# Patient Record
Sex: Female | Born: 1950 | Hispanic: Yes | Marital: Single | Smoking: Never smoker
Health system: Southern US, Community
[De-identification: ages and names within clinical notes are randomized; demographics above are authoritative.]

## PROBLEM LIST (undated history)

## (undated) DIAGNOSIS — Z973 Presence of spectacles and contact lenses: Secondary | ICD-10-CM

## (undated) DIAGNOSIS — T8859XA Other complications of anesthesia, initial encounter: Secondary | ICD-10-CM

## (undated) DIAGNOSIS — T4145XA Adverse effect of unspecified anesthetic, initial encounter: Secondary | ICD-10-CM

## (undated) DIAGNOSIS — S5290XA Unspecified fracture of unspecified forearm, initial encounter for closed fracture: Secondary | ICD-10-CM

## (undated) HISTORY — PX: CARPAL TUNNEL RELEASE: SHX101

---
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
## 2019-09-16 ENCOUNTER — Emergency Department (HOSPITAL_COMMUNITY): Payer: Self-pay

## 2019-09-16 ENCOUNTER — Other Ambulatory Visit: Payer: Self-pay

## 2019-09-16 ENCOUNTER — Emergency Department (HOSPITAL_COMMUNITY)
Admission: EM | Admit: 2019-09-16 | Discharge: 2019-09-16 | Disposition: A | Discharge status: Home / Self Care | Payer: Self-pay | Attending: Emergency Medicine | Admitting: Emergency Medicine

## 2019-09-16 DIAGNOSIS — S52611A Displaced fracture of right ulna styloid process, initial encounter for closed fracture: Secondary | ICD-10-CM | POA: Insufficient documentation

## 2019-09-16 DIAGNOSIS — W19XXXA Unspecified fall, initial encounter: Secondary | ICD-10-CM

## 2019-09-16 DIAGNOSIS — W0110XA Fall on same level from slipping, tripping and stumbling with subsequent striking against unspecified object, initial encounter: Secondary | ICD-10-CM | POA: Insufficient documentation

## 2019-09-16 DIAGNOSIS — Y999 Unspecified external cause status: Secondary | ICD-10-CM | POA: Insufficient documentation

## 2019-09-16 DIAGNOSIS — S52501A Unspecified fracture of the lower end of right radius, initial encounter for closed fracture: Secondary | ICD-10-CM | POA: Insufficient documentation

## 2019-09-16 DIAGNOSIS — Y9289 Other specified places as the place of occurrence of the external cause: Secondary | ICD-10-CM | POA: Insufficient documentation

## 2019-09-16 DIAGNOSIS — Y939 Activity, unspecified: Secondary | ICD-10-CM | POA: Insufficient documentation

## 2019-09-16 MED ORDER — FENTANYL CITRATE (PF) 100 MCG/2ML IJ SOLN
50.0000 ug | Freq: Once | INTRAMUSCULAR | Status: AC
  Administered 2019-09-16: 50 ug via INTRAVENOUS
  Filled 2019-09-16: qty 2

## 2019-09-16 MED ORDER — MORPHINE SULFATE (PF) 2 MG/ML IV SOLN
2.0000 mg | Freq: Once | INTRAVENOUS | Status: AC
  Administered 2019-09-16: 2 mg via INTRAVENOUS
  Filled 2019-09-16: qty 1

## 2019-09-16 MED ORDER — CEPHALEXIN 500 MG PO CAPS: 500.0000 mg | ORAL_CAPSULE | Freq: Four times a day (QID) | ORAL | 0 refills | Status: DC

## 2019-09-16 MED ORDER — CEPHALEXIN 500 MG PO CAPS
500.0000 mg | ORAL_CAPSULE | Freq: Once | ORAL | Status: AC
  Administered 2019-09-16: 500 mg via ORAL
  Filled 2019-09-16: qty 1

## 2019-09-16 MED ORDER — HYDROCODONE-ACETAMINOPHEN 5-325 MG PO TABS
1.0000 | ORAL_TABLET | Freq: Once | ORAL | Status: AC
  Administered 2019-09-16: 1 via ORAL
  Filled 2019-09-16: qty 1

## 2019-09-16 MED ORDER — LIDOCAINE HCL (PF) 1 % IJ SOLN
10.0000 mL | Freq: Once | INTRAMUSCULAR | Status: AC
  Administered 2019-09-16: 10 mL
  Filled 2019-09-16: qty 30

## 2019-09-16 MED ORDER — HYDROCODONE-ACETAMINOPHEN 5-325 MG PO TABS: 1.0000 | ORAL_TABLET | Freq: Four times a day (QID) | ORAL | 0 refills | Status: DC | PRN

## 2019-09-16 NOTE — ED Triage Notes (Addendum)
Pt was standing on curb to take picture of car at car lot when she stepped back and fell on right wrist. Pt denies LOC and hitting head. Pt presents in [place] d/t language barrier. Pt was hypotensive after fall (118/69). Pt was given of fentanyl via IV on scene.

## 2019-09-16 NOTE — ED Provider Notes (Signed)
.  Ortho Injury Treatment  Date/Time: 09/16/2019 6:40 PM Performed by: Wynetta Fines, MD Authorized by: Wynetta Fines, MD   Consent:    Consent obtained:  Verbal   Consent given by:  Patient   Risks discussed:  Fracture, irreducible dislocation, nerve damage, recurrent dislocation, restricted joint movement, stiffness and vascular damage   Alternatives discussed:  No treatmentInjury location: wrist Location details: right wrist Injury type: fracture Fracture type: distal radius Pre-procedure neurovascular assessment: neurovascularly intact Anesthesia: local infiltration  Anesthesia: Local anesthesia used: yes Local Anesthetic: lidocaine 1% without epinephrine Anesthetic total: 3 mL Manipulation performed: yes Skin traction used: no Skeletal traction used: yes Reduction successful: yes X-ray confirmed reduction: yes Immobilization: splint and sling Splint type: sugar tong Post-procedure neurovascular assessment: post-procedure neurovascularly intact Patient tolerance: patient tolerated the procedure well with no immediate complications      Wynetta Fines, MD 09/16/19 1842

## 2019-09-16 NOTE — ED Provider Notes (Signed)
[facility]-EMERGENCY DEPT Provider Note   CSN: [id number] Arrival date & time: 09/16/19  1526     History Chief Complaint  Patient presents with  . Fall  . Wrist Injury    Bianca Johnson is a 69 y.o. female.  Bianca Johnson is a 69 y.o. female who is otherwise healthy, presents to the ED for evaluation of right wrist injury.  Patient is Spanish-speaking, her niece is at the bedside and acts as a Nurse, learning disability, offered to get Radiation protection practitioner, but patient declined.  Patient states that she was at a car lot trying to take a picture, when she stepped back she did not realize there was a car behind her and fell, but caught herself on her right hand.  She did not fall completely to the ground, did not hit her head, denies any neck or back pain.  No pain in the chest or abdomen.  No pain in the lower extremities.  She restates that she put all her weight on her right hand and has significant pain and deformity to her right wrist.  Sam splint placed by EMS.  Denies numbness tingling or weakness.  When the wrist is held still pain is mild but with any movement of the hand she has severe pain.  Did not get much relief from initial dose of fentanyl given with EMS.        No past medical history on file.  There are no problems to display for this patient.    OB History   No obstetric history on file.     No family history on file.  Social History   Tobacco Use  . Smoking status: Not on file  Substance Use Topics  . Alcohol use: Not on file  . Drug use: Not on file    Home Medications Prior to Admission medications   Medication Sig Start Date End Date Taking? Authorizing Provider  cephALEXin (KEFLEX) 500 MG capsule Take 1 capsule (500 mg total) by mouth 4 (four) times daily. 09/16/19   Dartha Lodge, PA-C  HYDROcodone-acetaminophen (NORCO) 5-325 MG tablet Take 1 tablet by mouth every 6 (six) hours as needed. 09/16/19   Dartha Lodge, PA-C    Allergies    Patient  has no allergy information on record.  Review of Systems   Review of Systems  Constitutional: Negative for chills and fever.  HENT: Negative.   Eyes: Negative for visual disturbance.  Respiratory: Negative for cough and shortness of breath.   Cardiovascular: Negative for chest pain.  Gastrointestinal: Negative for abdominal pain.  Musculoskeletal: Positive for arthralgias and joint swelling. Negative for back pain and neck pain.  Skin: Negative for color change and wound.  Neurological: Negative for dizziness, syncope and light-headedness.  All other systems reviewed and are negative.   Physical Exam Updated Vital Signs BP 114/76 (BP Location: Right Arm)   Pulse 68   Temp 97.8 F (36.6 C) (Oral)   Resp 20   Ht 5' 1.42" (1.56 m)   Wt 61 kg   SpO2 100%   BMI 25.07 kg/m   Physical Exam Vitals and nursing note reviewed.  Constitutional:      General: She is not in acute distress.    Appearance: Normal appearance. She is well-developed. She is not diaphoretic.  HENT:     Head: Normocephalic.     Comments: No palpable hematoma, step-off or deformity, negative battle sign Eyes:     General:  Right eye: No discharge.        Left eye: No discharge.  Neck:     Comments: C-collar in place, no midline C-spine tenderness, collar removed with full range of motion without discomfort Cardiovascular:     Rate and Rhythm: Normal rate and regular rhythm.     Heart sounds: Normal heart sounds.  Pulmonary:     Effort: Pulmonary effort is normal. No respiratory distress.     Breath sounds: Normal breath sounds. No wheezing or rales.     Comments: No chest wall tenderness Chest:     Chest wall: No tenderness.  Abdominal:     General: Bowel sounds are normal. There is no distension.     Palpations: Abdomen is soft. There is no mass.     Tenderness: There is no abdominal tenderness. There is no guarding.     Comments: Abdomen nontender to palpation  Musculoskeletal:         General: Deformity present.     Cervical back: Neck supple.     Comments: Obvious deformity to the right wrist with swelling, tenderness to palpation, there is a very small abrasion over the ulnar aspect of the wrist.  2+ radial pulse and good cap refill, patient is able to move fingers although it is painful, sensation intact throughout.  Skin:    General: Skin is warm and dry.     Capillary Refill: Capillary refill takes less than 2 seconds.  Neurological:     Mental Status: She is alert.     Coordination: Coordination normal.     Comments: Speech is clear, able to follow commands Moves extremities without ataxia, coordination intact  Psychiatric:        Mood and Affect: Mood normal.        Behavior: Behavior normal.     ED Results / Procedures / Treatments   Labs (all labs ordered are listed, but only abnormal results are displayed) Labs Reviewed - No data to display  EKG None  Radiology DG Forearm Right  Result Date: 09/16/2019 CLINICAL DATA:  Trip and fall, pain EXAM: RIGHT WRIST - COMPLETE 3+ VIEW; RIGHT FOREARM - 2 VIEW COMPARISON:  None. FINDINGS: Comminuted, impacted and angulated fractures of the distal right radial metadiaphysis, not clearly intra-articular. Additional mildly displaced fracture of the right ulnar styloid. The carpus proper is normally aligned. No fracture or dislocation of the proximal radius or ulna. Soft tissue edema about the wrist. IMPRESSION: Comminuted, impacted and angulated fractures of the distal right radial metadiaphysis, not clearly intra-articular. Additional mildly displaced fracture of the right ulnar styloid. Electronically Signed   By: Lauralyn Primes M.D.   On: 09/16/2019 17:23   DG Wrist Complete Right  Result Date: 09/16/2019 CLINICAL DATA:  Status post reduction EXAM: RIGHT WRIST - COMPLETE 3+ VIEW COMPARISON:  Films from earlier in the same day. FINDINGS: Distal radial and ulnar fractures are again identified. The degree of impaction has  been reduced somewhat. The posteriorly displaced distal radial fracture fragments have been reduced somewhat although still are proximally 1/3 bone width displaced posteriorly. No other fractures are seen. IMPRESSION: Status post reduction and casting. There remains some posterior displacement of the distal radial fracture fragments as described. Electronically Signed   By: Alcide Clever M.D.   On: 09/16/2019 19:02   DG Wrist Complete Right  Result Date: 09/16/2019 CLINICAL DATA:  Trip and fall, pain EXAM: RIGHT WRIST - COMPLETE 3+ VIEW; RIGHT FOREARM - 2 VIEW COMPARISON:  None. FINDINGS:  Comminuted, impacted and angulated fractures of the distal right radial metadiaphysis, not clearly intra-articular. Additional mildly displaced fracture of the right ulnar styloid. The carpus proper is normally aligned. No fracture or dislocation of the proximal radius or ulna. Soft tissue edema about the wrist. IMPRESSION: Comminuted, impacted and angulated fractures of the distal right radial metadiaphysis, not clearly intra-articular. Additional mildly displaced fracture of the right ulnar styloid. Electronically Signed   By: Lauralyn Primes M.D.   On: 09/16/2019 17:23    Procedures Procedures (including critical care time)  Medications Ordered in ED Medications  fentaNYL (SUBLIMAZE) injection 50 mcg (50 mcg Intravenous Given 09/16/19 1630)  morphine 2 MG/ML injection 2 mg (2 mg Intravenous Given 09/16/19 1814)  lidocaine (PF) (XYLOCAINE) 1 % injection 10 mL (10 mLs Infiltration Given by Other 09/16/19 1816)  cephALEXin (KEFLEX) capsule 500 mg (500 mg Oral Given 09/16/19 1935)  HYDROcodone-acetaminophen (NORCO/VICODIN) 5-325 MG per tablet 1 tablet (1 tablet Oral Given 09/16/19 1935)    ED Course  I have reviewed the triage vital signs and the nursing notes.  Pertinent labs & imaging results that were available during my care of the patient were reviewed by me and considered in my medical decision making (see chart for  details).    MDM Rules/Calculators/A&P                     69 year old female presents via EMS after a witnessed fall where she caught herself on the right hand and then had pain and deformity to the right wrist.  There is a small abrasion to the ulnar aspect of the wrist, but this does not appear to be an open fracture.  EMS applied c-collar due to language barrier at the scene, but after speaking with patient with translator she did not fall completely to the ground did not hit her head and has no neck pain.  I examined the patient's neck and she has no midline tenderness and full range of motion, C-spine cleared Via Nexus criteria and c-collar removed.  No other signs of injury or trauma on exam.  Will get plain films of the right wrist and right forearm.  The right upper extremity is neurovascularly intact.  Range of motion limited due to pain.  IV pain medication given.  X-rays show comminuted, impacted and angulated fractures of the right distal radial metadiaphysis, there is also mildly displaced right ulnar styloid fracture.  Will discuss with on-call hand surgeon.  Case discussed with Dr. Roney Mans with hand surgery who requests hematoma block and reduction here in the emergency department, followed by splinting, he will then see the patient for follow-up in the office, this will require surgical repair.  Hematoma block and reduction performed by Dr. Rodena Medin, please see his separate documentation.  Fingertrap and weights use.  Patient placed in sugar tong splint.  Patient reports significant improvement in pain after splinting, good sensation and no discoloration of the fingers after splint application.  Post reduction x-rays show some improvement in displacement.  Patient given dose of oral pain medicine as well as Keflex to prevent any infection given there is a small abrasion underneath the splint, this was cleaned and a dressing was applied.  Discussed appropriate follow-up, and return  precautions with the patient and her niece.  They expressed understanding and agreement with plan.  Discharged home in good condition.   Final Clinical Impression(s) / ED Diagnoses Final diagnoses:  Fall, initial encounter  Closed fracture of distal end of  right radius, unspecified fracture morphology, initial encounter  Closed displaced fracture of styloid process of right ulna, initial encounter    Rx / DC Orders ED Discharge Orders         Ordered    cephALEXin (KEFLEX) 500 MG capsule  4 times daily     09/16/19 1937    HYDROcodone-acetaminophen (NORCO) 5-325 MG tablet  Every 6 hours PRN     09/16/19 1937           Janet Berlin 09/16/19 1958    Valarie Merino, MD 09/16/19 2111

## 2019-09-16 NOTE — Discharge Instructions (Addendum)
You have a fracture to your wrist that was placed in a splint today.  You will need to remain in the splint until you see Dr. Roney Mans with orthopedics.  This will need surgery to repair.  Elevate the wrist and apply ice.  Take Tylenol 650 mg every 6 hours, in addition to this use prescribed pain medication, this medicine can cause drowsiness, do not take before driving.  Take Keflex to help prevent infection around small cut on the wrist.  If you develop significantly worsened pain numbness or tingling in your fingers, or any discoloration of the fingers unwrap the splint and come back to the emergency department  -----------------------------------------------------------------------------------------  Shelle Iron fractura en la mueca que le colocaron una frula hoy. Deber permanecer en la frula hasta que vea al Dr. Roney Mans con ortopedia. Esto requerir ciruga para repararlo.  Eleve la mueca y aplique hielo. Tome Tylenol 650 mg cada 6 horas, adems de [address] de analgsicos recetados, este medicamento puede causar somnolencia, no lo tome antes de conducir.  Tome Keflex para ayudar a prevenir infecciones alrededor de una pequea herida en la mueca.  Si desarrolla un dolor que [employer], entumecimiento u hormigueo en los dedos, o cualquier decoloracin de los dedos, desenvuelva la frula y regrese al departamento de Sports administrator.

## 2019-09-20 ENCOUNTER — Other Ambulatory Visit (HOSPITAL_COMMUNITY)
Admission: RE | Admit: 2019-09-20 | Discharge: 2019-09-20 | Disposition: A | Discharge status: Home / Self Care | Payer: HRSA Program | Source: Ambulatory Visit | Attending: Orthopaedic Surgery | Admitting: Orthopaedic Surgery

## 2019-09-20 ENCOUNTER — Other Ambulatory Visit: Payer: Self-pay

## 2019-09-20 ENCOUNTER — Encounter (HOSPITAL_COMMUNITY): Payer: Self-pay | Admitting: Orthopaedic Surgery

## 2019-09-20 DIAGNOSIS — Z20822 Contact with and (suspected) exposure to covid-19: Secondary | ICD-10-CM | POA: Diagnosis not present

## 2019-09-20 DIAGNOSIS — Z01812 Encounter for preprocedural laboratory examination: Secondary | ICD-10-CM | POA: Diagnosis present

## 2019-09-20 LAB — SARS CORONAVIRUS 2 (TAT 6-24 HRS): SARS Coronavirus 2: NEGATIVE

## 2019-09-20 NOTE — Progress Notes (Signed)
SDW-pre-op call completed by pt using Spanish [employer] # [phone]. Pt denies SOB, chest pain, and being under the care of a cardiologist. Pt denies having a PCP. Pt denies having a stress test, echo and cardiac cath. Pt denies having an EKG and chest x ray. Pt denies recent labs. Pt made aware to stop taking  Aspirin (unless otherwise advised by surgeon), vitamins, fish oil and herbal medications. Do not take any NSAIDs ie: Ibuprofen, Advil, Naproxen (Aleve), Motrin, BC and Goody Powder. Pt reminded to quarantine. Pt verbalized understanding of all pre-op instructions.

## 2019-09-21 ENCOUNTER — Encounter (HOSPITAL_COMMUNITY): Payer: Self-pay | Admitting: Orthopaedic Surgery

## 2019-09-21 ENCOUNTER — Other Ambulatory Visit: Payer: Self-pay

## 2019-09-21 ENCOUNTER — Ambulatory Visit (HOSPITAL_COMMUNITY): Payer: Self-pay | Admitting: Anesthesiology

## 2019-09-21 ENCOUNTER — Encounter (HOSPITAL_COMMUNITY)
Admission: RE | Disposition: A | Discharge status: Home / Self Care | Payer: Self-pay | Source: Home / Self Care | Attending: Orthopaedic Surgery

## 2019-09-21 ENCOUNTER — Ambulatory Visit (HOSPITAL_COMMUNITY)
Admission: RE | Admit: 2019-09-21 | Discharge: 2019-09-21 | Disposition: A | Discharge status: Home / Self Care | Payer: Self-pay | Attending: Orthopaedic Surgery | Admitting: Orthopaedic Surgery

## 2019-09-21 DIAGNOSIS — W19XXXA Unspecified fall, initial encounter: Secondary | ICD-10-CM | POA: Insufficient documentation

## 2019-09-21 DIAGNOSIS — Z79899 Other long term (current) drug therapy: Secondary | ICD-10-CM | POA: Insufficient documentation

## 2019-09-21 DIAGNOSIS — S52611A Displaced fracture of right ulna styloid process, initial encounter for closed fracture: Secondary | ICD-10-CM | POA: Insufficient documentation

## 2019-09-21 DIAGNOSIS — S52571A Other intraarticular fracture of lower end of right radius, initial encounter for closed fracture: Secondary | ICD-10-CM | POA: Insufficient documentation

## 2019-09-21 HISTORY — DX: Other complications of anesthesia, initial encounter: T88.59XA

## 2019-09-21 HISTORY — DX: Unspecified fracture of unspecified forearm, initial encounter for closed fracture: S52.90XA

## 2019-09-21 HISTORY — PX: ORIF WRIST FRACTURE: SHX2133

## 2019-09-21 HISTORY — DX: Presence of spectacles and contact lenses: Z97.3

## 2019-09-21 LAB — CBC
HCT: 39.6 % (ref 36.0–46.0)
Hemoglobin: 12.8 g/dL (ref 12.0–15.0)
MCH: 28.6 pg (ref 26.0–34.0)
MCHC: 32.3 g/dL (ref 30.0–36.0)
MCV: 88.6 fL (ref 80.0–100.0)
Platelets: 257 10*3/uL (ref 150–400)
RBC: 4.47 MIL/uL (ref 3.87–5.11)
RDW: 13.3 % (ref 11.5–15.5)
WBC: 6 10*3/uL (ref 4.0–10.5)
nRBC: 0 % (ref 0.0–0.2)

## 2019-09-21 LAB — SURGICAL PCR SCREEN
MRSA, PCR: NEGATIVE
Staphylococcus aureus: NEGATIVE

## 2019-09-21 SURGERY — OPEN REDUCTION INTERNAL FIXATION (ORIF) WRIST FRACTURE
Anesthesia: Monitor Anesthesia Care | Site: Wrist | Laterality: Right

## 2019-09-21 MED ORDER — PROPOFOL 500 MG/50ML IV EMUL
INTRAVENOUS | Status: DC | PRN
  Administered 2019-09-21: 150 ug/kg/min via INTRAVENOUS

## 2019-09-21 MED ORDER — MIDAZOLAM HCL 2 MG/2ML IJ SOLN
INTRAMUSCULAR | Status: AC
  Administered 2019-09-21: 1 mg via INTRAVENOUS
  Filled 2019-09-21: qty 2

## 2019-09-21 MED ORDER — MIDAZOLAM HCL 2 MG/2ML IJ SOLN: 1.0000 mg | Freq: Once | INTRAMUSCULAR | Status: AC

## 2019-09-21 MED ORDER — OXYCODONE HCL 5 MG/5ML PO SOLN: 5.0000 mg | Freq: Once | ORAL | Status: DC | PRN

## 2019-09-21 MED ORDER — FENTANYL CITRATE (PF) 250 MCG/5ML IJ SOLN
INTRAMUSCULAR | Status: AC
  Filled 2019-09-21: qty 5

## 2019-09-21 MED ORDER — FENTANYL CITRATE (PF) 100 MCG/2ML IJ SOLN: 50.0000 ug | Freq: Once | INTRAMUSCULAR | Status: AC

## 2019-09-21 MED ORDER — LACTATED RINGERS IV SOLN: INTRAVENOUS | Status: DC

## 2019-09-21 MED ORDER — HYDROCODONE-ACETAMINOPHEN 5-325 MG PO TABS: 1.0000 | ORAL_TABLET | Freq: Four times a day (QID) | ORAL | 0 refills | Status: AC | PRN

## 2019-09-21 MED ORDER — CHLORHEXIDINE GLUCONATE 4 % EX LIQD: 60.0000 mL | Freq: Once | CUTANEOUS | Status: DC

## 2019-09-21 MED ORDER — CEFAZOLIN SODIUM-DEXTROSE 2-4 GM/100ML-% IV SOLN
INTRAVENOUS | Status: AC
  Filled 2019-09-21: qty 100

## 2019-09-21 MED ORDER — PROPOFOL 10 MG/ML IV BOLUS
INTRAVENOUS | Status: AC
  Filled 2019-09-21: qty 20

## 2019-09-21 MED ORDER — POVIDONE-IODINE 10 % EX SWAB
2.0000 "application " | Freq: Once | CUTANEOUS | Status: AC
  Administered 2019-09-21: 2 via TOPICAL

## 2019-09-21 MED ORDER — PHENYLEPHRINE HCL-NACL 10-0.9 MG/250ML-% IV SOLN
INTRAVENOUS | Status: DC | PRN
  Administered 2019-09-21: 40 ug/min via INTRAVENOUS

## 2019-09-21 MED ORDER — ONDANSETRON HCL 4 MG/2ML IJ SOLN
INTRAMUSCULAR | Status: DC | PRN
  Administered 2019-09-21: 4 mg via INTRAVENOUS

## 2019-09-21 MED ORDER — CLONIDINE HCL (ANALGESIA) 100 MCG/ML EP SOLN
EPIDURAL | Status: DC | PRN
  Administered 2019-09-21: 100 ug

## 2019-09-21 MED ORDER — OXYCODONE HCL 5 MG PO TABS: 5.0000 mg | ORAL_TABLET | Freq: Once | ORAL | Status: DC | PRN

## 2019-09-21 MED ORDER — PHENYLEPHRINE 40 MCG/ML (10ML) SYRINGE FOR IV PUSH (FOR BLOOD PRESSURE SUPPORT)
PREFILLED_SYRINGE | INTRAVENOUS | Status: DC | PRN
  Administered 2019-09-21 (×3): 120 ug via INTRAVENOUS

## 2019-09-21 MED ORDER — CEFAZOLIN SODIUM-DEXTROSE 2-4 GM/100ML-% IV SOLN
2.0000 g | INTRAVENOUS | Status: AC
  Administered 2019-09-21: 2 g via INTRAVENOUS

## 2019-09-21 MED ORDER — ACETAMINOPHEN 500 MG PO TABS: 1000.0000 mg | ORAL_TABLET | Freq: Once | ORAL | Status: AC

## 2019-09-21 MED ORDER — 0.9 % SODIUM CHLORIDE (POUR BTL) OPTIME
TOPICAL | Status: DC | PRN
  Administered 2019-09-21: 15:00:00 1000 mL

## 2019-09-21 MED ORDER — BUPIVACAINE-EPINEPHRINE (PF) 0.5% -1:200000 IJ SOLN
INTRAMUSCULAR | Status: DC | PRN
  Administered 2019-09-21: 30 mL via PERINEURAL

## 2019-09-21 MED ORDER — PROMETHAZINE HCL 25 MG/ML IJ SOLN: 6.2500 mg | INTRAMUSCULAR | Status: DC | PRN

## 2019-09-21 MED ORDER — ACETAMINOPHEN 500 MG PO TABS
ORAL_TABLET | ORAL | Status: AC
  Administered 2019-09-21: 14:00:00 1000 mg via ORAL
  Filled 2019-09-21: qty 2

## 2019-09-21 MED ORDER — FENTANYL CITRATE (PF) 100 MCG/2ML IJ SOLN
INTRAMUSCULAR | Status: AC
  Administered 2019-09-21: 50 ug via INTRAVENOUS
  Filled 2019-09-21: qty 2

## 2019-09-21 MED ORDER — FENTANYL CITRATE (PF) 100 MCG/2ML IJ SOLN: 25.0000 ug | INTRAMUSCULAR | Status: DC | PRN

## 2019-09-21 SURGICAL SUPPLY — 55 items
ALCOHOL ISOPROPYL (RUBBING) (MISCELLANEOUS) ×6 IMPLANT
BIT DRILL 2.0 LNG QUCK RELEASE (BIT) ×1 IMPLANT
BIT DRILL QC 2.8X5 (BIT) ×3 IMPLANT
BNDG ELASTIC 3X5.8 VLCR STR LF (GAUZE/BANDAGES/DRESSINGS) ×3 IMPLANT
BNDG ELASTIC 4X5.8 VLCR STR LF (GAUZE/BANDAGES/DRESSINGS) ×3 IMPLANT
BNDG ESMARK 4X9 LF (GAUZE/BANDAGES/DRESSINGS) ×3 IMPLANT
BNDG GAUZE ELAST 4 BULKY (GAUZE/BANDAGES/DRESSINGS) ×3 IMPLANT
CANISTER SUCT 3000ML PPV (MISCELLANEOUS) ×3 IMPLANT
CHLORAPREP W/TINT 26 (MISCELLANEOUS) ×3 IMPLANT
CORD BIPOLAR FORCEPS 12FT (ELECTRODE) ×3 IMPLANT
COVER SURGICAL LIGHT HANDLE (MISCELLANEOUS) ×3 IMPLANT
COVER WAND RF STERILE (DRAPES) IMPLANT
CUFF TOURN SGL QUICK 18X4 (TOURNIQUET CUFF) ×3 IMPLANT
CUFF TOURN SGL QUICK 24 (TOURNIQUET CUFF)
CUFF TRNQT CYL 24X4X16.5-23 (TOURNIQUET CUFF) IMPLANT
DRAPE OEC MINIVIEW 54X84 (DRAPES) ×3 IMPLANT
DRAPE SURG 17X23 STRL (DRAPES) ×3 IMPLANT
DRILL 2.0 LNG QUICK RELEASE (BIT) ×3
GAUZE SPONGE 4X4 12PLY STRL (GAUZE/BANDAGES/DRESSINGS) ×3 IMPLANT
GAUZE XEROFORM 1X8 LF (GAUZE/BANDAGES/DRESSINGS) ×3 IMPLANT
GLOVE INDICATOR 8.0 STRL GRN (GLOVE) ×3 IMPLANT
GLOVE SURG SYN 7.5  E (GLOVE) ×2
GLOVE SURG SYN 7.5 E (GLOVE) ×1 IMPLANT
GOWN STRL REUS W/ TWL LRG LVL3 (GOWN DISPOSABLE) ×2 IMPLANT
GOWN STRL REUS W/TWL LRG LVL3 (GOWN DISPOSABLE) ×4
GUIDEWIRE ORTHO 0.054X6 (WIRE) ×9 IMPLANT
KIT BASIN OR (CUSTOM PROCEDURE TRAY) ×3 IMPLANT
KIT TURNOVER KIT B (KITS) ×3 IMPLANT
NEEDLE 22X1 1/2 (OR ONLY) (NEEDLE) IMPLANT
NS IRRIG 1000ML POUR BTL (IV SOLUTION) ×3 IMPLANT
PACK ORTHO EXTREMITY (CUSTOM PROCEDURE TRAY) ×3 IMPLANT
PAD ARMBOARD 7.5X6 YLW CONV (MISCELLANEOUS) ×6 IMPLANT
PAD CAST 3X4 CTTN HI CHSV (CAST SUPPLIES) ×1 IMPLANT
PAD CAST 4YDX4 CTTN HI CHSV (CAST SUPPLIES) ×1 IMPLANT
PADDING CAST COTTON 3X4 STRL (CAST SUPPLIES) ×2
PADDING CAST COTTON 4X4 STRL (CAST SUPPLIES) ×2
PLATE ACULOCK 2 NARROW RT (Plate) ×3 IMPLANT
SCREW 2.3X12MM (Screw) ×3 IMPLANT
SCREW CORT FT 18X2.3XLCK HEX (Screw) ×5 IMPLANT
SCREW CORTICAL LOCKING 2.3X14M (Screw) ×3 IMPLANT
SCREW CORTICAL LOCKING 2.3X18M (Screw) ×10 IMPLANT
SCREW HEXALOBE LOCKING 3.5X14M (Screw) ×3 IMPLANT
SCREW LOCK 12X3.5X HEXALOBE (Screw) ×1 IMPLANT
SCREW LOCKING 3.5X12 (Screw) ×2 IMPLANT
SCREW NONLOCK HEX 3.5X12 (Screw) ×3 IMPLANT
SPLINT FIBERGLASS 3X12 (CAST SUPPLIES) ×3 IMPLANT
SUT PROLENE 4 0 PS 2 18 (SUTURE) ×6 IMPLANT
SUT VIC AB 3-0 PS2 18 (SUTURE) IMPLANT
SYR CONTROL 10ML LL (SYRINGE) IMPLANT
TOWEL GREEN STERILE (TOWEL DISPOSABLE) ×3 IMPLANT
TOWEL GREEN STERILE FF (TOWEL DISPOSABLE) ×3 IMPLANT
TUBE CONNECTING 12'X1/4 (SUCTIONS) ×1
TUBE CONNECTING 12X1/4 (SUCTIONS) ×2 IMPLANT
UNDERPAD 30X30 (UNDERPADS AND DIAPERS) ×3 IMPLANT
WATER STERILE IRR 1000ML POUR (IV SOLUTION) ×3 IMPLANT

## 2019-09-21 NOTE — Discharge Instructions (Signed)
Discharge Instructions ° °- Keep dressings in place. Do not remove them. °- The dressings must stay dry °- Take all medication as prescribed. Transition to over the counter pain medication as your pain improves °- Keep the hand elevated over the next 48-72 hours to help with pain and swelling °- Move all digits not restricted by the dressings regularly to prevent stiffness °- Please call to schedule a follow up appointment with Dr. Konstantinos Cordoba at [phone] for 10-14 days following surgery °- Your pain medication have been send digitally to your pharmacy ° °

## 2019-09-21 NOTE — Anesthesia Preprocedure Evaluation (Addendum)
Anesthesia Evaluation  Patient identified by MRN, date of birth, ID band Patient awake    Reviewed: Allergy & Precautions, NPO status , Patient's Chart, lab work & pertinent test results  History of Anesthesia Complications Negative for: history of anesthetic complications  Airway Mallampati: II  TM Distance: >3 FB Neck ROM: Full    Dental no notable dental hx.    Pulmonary neg pulmonary ROS,    Pulmonary exam normal        Cardiovascular negative cardio ROS Normal cardiovascular exam     Neuro/Psych negative neurological ROS  negative psych ROS   GI/Hepatic negative GI ROS, Neg liver ROS,   Endo/Other  negative endocrine ROS  Renal/GU negative Renal ROS  negative genitourinary   Musculoskeletal Right radius fracture   Abdominal   Peds  Hematology negative hematology ROS (+)   Anesthesia Other Findings Day of surgery medications reviewed with patient.  Reproductive/Obstetrics negative OB ROS                            Anesthesia Physical Anesthesia Plan  ASA: I  Anesthesia Plan: Regional and MAC   Post-op Pain Management:    Induction:   PONV Risk Score and Plan: 2 and Treatment may vary due to age or medical condition, Propofol infusion and Ondansetron  Airway Management Planned: Natural Airway and Simple Face Mask  Additional Equipment: None  Intra-op Plan:   Post-operative Plan:   Informed Consent: I have reviewed the patients History and Physical, chart, labs and discussed the procedure including the risks, benefits and alternatives for the proposed anesthesia with the patient or authorized representative who has indicated his/her understanding and acceptance.       Plan Discussed with: CRNA  Anesthesia Plan Comments:        Anesthesia Quick Evaluation

## 2019-09-21 NOTE — Op Note (Signed)
PREOPERATIVE DIAGNOSIS: Displaced, right three-part intra-articular distal radius fracture with ulnar styloid avulsion fracture  POSTOPERATIVE DIAGNOSIS: Same  ATTENDING PHYSICIAN: Maudry Mayhew. Jeannie Fend, III, MD who was present and scrubbed for the entire case   ASSISTANT SURGEON: None.   ANESTHESIA: Regional with MAC  SURGICAL PROCEDURES: Open reduction and internal fixation of right three-part intra-articular distal radius fracture  SURGICAL INDICATIONS: Patient is a 69 year old female who over the weekend had a fall onto an outstretched right arm.  She was initially seen in the ER where she was found to have a comminuted and displaced, intra-articular distal radius fracture.  She underwent a closed reduction in the ER with significant provement in her alignment of her fracture but still had persistent displacement of the articular segment, dorsal comminution and dorsal angulation of the distal radius articular surface.  I had a long discussion with her regarding both operative and nonoperative treatment measures in clinic.  I did recommend proceeding forward surgical fixation secondary to the persistent displacement and angulation of her distal radius.  We discussed the risks of surgery in clinic and she did wish to proceed and presents today for surgical fixation.  FINDING: There was a comminuted, intra-articular distal radius fracture.  There were multiple, comminuted segments of the dorsal metaphysis.  Additionally the articular surface was impacted.  Near-anatomic alignment was achieved and stable fixation was obtained using a volar locking plate and screw construct.  DESCRIPTION OF PROCEDURE: The patient was identified in the preoperative holding area where the risk benefits and alternatives of the procedure were once again discussed the patient.  These include but not limited to infection, bleeding, damage to surrounding structures including blood vessels and nerves, pain, stiffness, malunion,  nonunion, implant failure and need for additional procedures.  Informed consent was obtained at that time and the patient's right wrist was marked with a surgical marking pen.  She then underwent a right upper extremity plexus block by anesthesia.  She was brought to the operative suite where timeout was performed identifying the correct patient operative site.  She was positioned supine on the operative table with her hand outstretched on a hand table.  She was induced under MAC sedation.  A tourniquet is placed on the upper arm and the arm was then prepped and draped in usual sterile fashion.  The limb was exsanguinated and the tourniquet was inflated.  A longitudinal incision was made along the palmar aspect of the distal forearm.  A standard FCR approach was used with the incision made over the sheath to the FCR tendon.  The tendon was mobilized ulnarly and the deep subsheath was then incised using a 15 blade.  The FPL tendon was then bluntly dissected off and mobilized ulnarly as well.  The pronator quadratus was then visualized and incised and elevated off the volar distal radius.  In doing so there was found to be a persistent displacement of the, comminuted intra-articular distal radius fracture.  The articular surface was displaced dorsally.  The brachioradialis was released off the radial styloid with a 15 blade.  At this point the fracture was manipulated utilizing traction and elevation of the fracture using a Freer.  Provisional fixation and alignment was achieved utilizing a K wire through the radial styloid.  The articular surface was impacted and was elevated utilizing a Freer.  At this point volar locking plate was placed along the radius and pinned into place.  The kickstand technique was used to help correct some residual depression of the joint  surface and dorsal angulation.  This was pinned in the place and confirmed to be in appropriate positioning of both AP and lateral fluoroscopic images.   A clamp was placed on the plate to compress it down to the bone distally and then 2 locking screws were placed in the distal row.  At this point the kickstand was removed and the plate was reduced down to the volar, distal radius and a cortical screw was placed.  Once again fluoroscopic images were obtained which showed elevation of the articular surface to a near anatomic alignment.  At this point multiple locking screws were placed in the plate both proximally distally.  This achieved stable fixation in near-anatomic alignment on both the AP and lateral planes.  There is some mild translation of the distal segment radially which was seen once final images were being taken.  Decision was made to accept this as I was concerned that based on the small and unstable distal segment I would lose fixation of the articular surface.  The DRUJ was stressed in both pronation and supination was found to be stable.  At this point the wound was copiously irrigated with normal saline.  The skin was closed with interrupted 4-0 Prolene sutures.  Xeroform, 4 x 4's and a well-padded volar slab splint was placed.  The tourniquet was released and the patient had return of brisk capillary refill to all of her digits.  She was awoken from her sedation and taken to the PACU in stable condition.  She tolerated the procedure well and there were no complications.  RADIOGRAPHIC INTERPRETATION: AP, lateral and fossa lateral fluoroscopic images were obtained intraoperative.  These show near-anatomic alignment of the previously displaced and comminuted distal radius fracture.  There is some mild radial translation of the distal segment.  Volar locking plate and screw construct are in place and appropriately positioned.  All screw lengths are appropriate as well.  ESTIMATED BLOOD LOSS: Less than 20 mL  TOURNIQUET TIME: Approximately 45 minutes  SPECIMENS: None  POSTOPERATIVE PLAN: The patient will be discharged home and follow-up with  me in approximately 10 to 14 days.  At that point her splint will be removed as well her sutures.  We will likely place her into a short arm cast at that time secondary to the comminuted, unstable nature of her fracture.  IMPLANTS: Acumed narrow, distally fitting volar locking plate and screw construct.

## 2019-09-21 NOTE — Anesthesia Procedure Notes (Signed)
Anesthesia Regional Block: Supraclavicular block   Pre-Anesthetic Checklist: ,, timeout performed, Correct Patient, Correct Site, Correct Laterality, Correct Procedure, Correct Position, site marked, Risks and benefits discussed, pre-op evaluation,  At surgeon's request and post-op pain management  Laterality: Right  Prep: Maximum Sterile Barrier Precautions used, chloraprep       Needles:  Injection technique: Single-shot  Needle Type: Echogenic Stimulator Needle     Needle Length: 9cm  Needle Gauge: 22     Additional Needles:   Procedures:,,,, ultrasound used (permanent image in chart),,,,  Narrative:  Start time: 09/21/2019 2:58 PM End time: 09/21/2019 3:01 PM Injection made incrementally with aspirations every 5 mL.  Performed by: Personally  Anesthesiologist: Kaylyn Layer, MD  Additional Notes: Risks, benefits, and alternative discussed. Patient gave consent for procedure. Patient prepped and draped in sterile fashion. Sedation administered, patient remains easily responsive to voice. Relevant anatomy identified with ultrasound guidance. Local anesthetic given in 5cc increments with no signs or symptoms of intravascular injection. No pain or paraesthesias with injection. Patient monitored throughout procedure with signs of LAST or immediate complications. Tolerated well. Ultrasound image placed in chart.  Amalia Greenhouse, MD

## 2019-09-21 NOTE — Transfer of Care (Signed)
Immediate Anesthesia Transfer of Care Note  Patient: Bianca Johnson  Procedure(s) Performed: Right distal radius open reduction, internal fixation and surgery as indicated (Right Wrist)  Patient Location: PACU  Anesthesia Type:MAC  Level of Consciousness: awake  Airway & Oxygen Therapy: Patient Spontanous Breathing  Post-op Assessment: Report given to RN and Post -op Vital signs reviewed and stable  Post vital signs: Reviewed and stable  Last Vitals:  Vitals Value Taken Time  BP 89/54 09/21/19 1750  Temp    Pulse 71 09/21/19 1751  Resp 14 09/21/19 1751  SpO2 100 % 09/21/19 1751  Vitals shown include unvalidated device data.  Last Pain:  Vitals:   09/21/19 1516  TempSrc:   PainSc: 0-No pain         Complications: No apparent anesthesia complications

## 2019-09-21 NOTE — H&P (Signed)
ORTHOPAEDIC H&P  PCP:  Patient, No Pcp Per  Chief Complaint: Right wrist pain  HPI: Bianca Johnson is a 69 y.o. female who complains of right wrist pain.  Over the weekend she had a fall onto an outstretched right arm which resulted in the comminuted and displaced intra-articular right distal radius fracture.  She underwent closed reduction in the ER as well as splint application.  She was subsequently sent to see me in clinic.  On exam she still had persistent comminuted displacement of the distal radius and I did did recommend proceeding forward with open reduction and internal fixation of her right wrist.  After discussing at length surgical risks and intervention, she did wish to proceed and presents today for operative fixation of her right wrist.  Past Medical History:  Diagnosis Date  . Complication of anesthesia    Pt stated that when she get anesthesia she " gets a lttle bit of tachycardia"  . Radius fracture    displaced right distal radius fracture  . Wears glasses    Past Surgical History:  Procedure Laterality Date  . CARPAL TUNNEL RELEASE     left hand   Social History   Socioeconomic History  . Marital status: Single    Spouse name: Not on file  . Number of children: Not on file  . Years of education: Not on file  . Highest education level: Not on file  Occupational History  . Not on file  Tobacco Use  . Smoking status: Never Smoker  . Smokeless tobacco: Never Used  Substance and Sexual Activity  . Alcohol use: Not Currently  . Drug use: Never  . Sexual activity: Not on file  Other Topics Concern  . Not on file  Social History Narrative  . Not on file   Social Determinants of Health   Financial Resource Strain:   . Difficulty of Paying Living Expenses:   Food Insecurity:   . Worried About Programme researcher, broadcasting/film/video in the Last Year:   . Barista in the Last Year:   Transportation Needs:   . Freight forwarder (Medical):   Marland Kitchen Lack of  Transportation (Non-Medical):   Physical Activity:   . Days of Exercise per Week:   . Minutes of Exercise per Session:   Stress:   . Feeling of Stress :   Social Connections:   . Frequency of Communication with Friends and Family:   . Frequency of Social Gatherings with Friends and Family:   . Attends Religious Services:   . Active Member of Clubs or Organizations:   . Attends Banker Meetings:   Marland Kitchen Marital Status:    History reviewed. No pertinent family history. No Known Allergies Prior to Admission medications   Medication Sig Start Date End Date Taking? Authorizing Provider  cephALEXin (KEFLEX) 500 MG capsule Take 1 capsule (500 mg total) by mouth 4 (four) times daily. 09/16/19  Yes Dartha Lodge, PA-C  HYDROcodone-acetaminophen (NORCO) 5-325 MG tablet Take 1 tablet by mouth every 6 (six) hours as needed. Patient taking differently: Take 1 tablet by mouth every 6 (six) hours as needed for severe pain.  09/16/19  Yes Dartha Lodge, PA-C  Zolpidem Tartrate (AMBIEN PO) Take 7.5 mg by mouth at bedtime.   Yes [provider]   No results found.  Positive ROS: All other systems have been reviewed and were otherwise negative with the exception of those mentioned in the HPI and as  above.  Physical Exam: General: Alert, no acute distress Cardiovascular: No pedal edema Respiratory: No cyanosis, no use of accessory musculature Skin: No lesions in the area of chief complaint Psychiatric: Patient is competent for consent with normal mood and affect  MUSCULOSKELETAL: Examination of the right upper extremity shows a well fitting sugar-tong splint. This was left in place secondary to her prior reduction in the ER. The exposed digits show some mild ecchymosis and swelling. She has intact flexion and extension of all digits and is nearly able to make a full fist. Her fingertips are all warm and well-perfused with brisk capillary refill. Her sensation is grossly intact to light  touch throughout all digits. Through her splint though she does have significant tenderness to palpation to both the distal radius and ulna.  Assessment: Comminuted and intra-articular right distal radius fracture  Plan: Plan to proceed forward with surgery for open reduction and internal fixation of her right distal radius fracture.  The risks of surgery were extensively discussed with her in clinic and informed consent was obtained today.  These risks include but are not limited to infection, bleeding, damage to surrounding structures including blood vessels and nerves, pain, stiffness, malunion, nonunion, implant failure need for additional procedures.  Her right upper extremity was marked.  We will plan for discharge home postoperatively and follow-up with me in approximately 10 to 14 days.    Verner Mould, MD [phone]   09/21/2019 4:24 PM

## 2019-09-21 NOTE — Anesthesia Postprocedure Evaluation (Signed)
Anesthesia Post Note  Patient: Bianca Johnson  Procedure(s) Performed: Right distal radius open reduction, internal fixation and surgery as indicated (Right Wrist)     Patient location during evaluation: PACU Anesthesia Type: Regional Level of consciousness: awake and alert and oriented Pain management: pain level controlled Vital Signs Assessment: post-procedure vital signs reviewed and stable Respiratory status: spontaneous breathing, nonlabored ventilation and respiratory function stable Cardiovascular status: blood pressure returned to baseline Postop Assessment: no apparent nausea or vomiting Anesthetic complications: no    Last Vitals:  Vitals:   09/21/19 1805 09/21/19 1820  BP: 104/75 103/70  Pulse: 85 80  Resp: 17 16  Temp: (!) 36.4 C (!) 36.4 C  SpO2: 99% 99%    Last Pain:  Vitals:   09/21/19 1805  TempSrc:   PainSc: 0-No pain                 Kaylyn Layer

## 2019-09-23 ENCOUNTER — Encounter: Payer: Self-pay | Admitting: *Deleted

## 2019-10-17 ENCOUNTER — Other Ambulatory Visit: Payer: Self-pay

## 2019-10-17 ENCOUNTER — Ambulatory Visit: Payer: Self-pay | Attending: Orthopaedic Surgery | Admitting: Occupational Therapy

## 2019-10-17 DIAGNOSIS — M6281 Muscle weakness (generalized): Secondary | ICD-10-CM | POA: Insufficient documentation

## 2019-10-17 DIAGNOSIS — R278 Other lack of coordination: Secondary | ICD-10-CM | POA: Insufficient documentation

## 2019-10-17 DIAGNOSIS — M25631 Stiffness of right wrist, not elsewhere classified: Secondary | ICD-10-CM | POA: Insufficient documentation

## 2019-10-17 DIAGNOSIS — M25531 Pain in right wrist: Secondary | ICD-10-CM | POA: Insufficient documentation

## 2019-10-17 DIAGNOSIS — R600 Localized edema: Secondary | ICD-10-CM | POA: Insufficient documentation

## 2019-10-17 NOTE — Therapy (Signed)
[facility] [address] [place], [place], [address] Phone: [phone]   Fax:  [phone]  Occupational Therapy Evaluation  Patient Details  Name: Bianca Johnson MRN: [id number] Date of Birth: 1951/03/17 Referring Provider (OT): Dr. Roney Mans   Encounter Date: 10/17/2019  OT End of Session - 10/17/19 0729    Visit Number  1    Number of Visits  25    Date for OT Re-Evaluation  01/15/20    Authorization Type  self pay    OT Start Time  0720    OT Stop Time  0759    OT Time Calculation (min)  39 min    Activity Tolerance  Patient tolerated treatment well    Behavior During Therapy  [facility] for tasks assessed/performed       Past Medical History:  Diagnosis Date  . Complication of anesthesia    Pt stated that when she get anesthesia she " gets a lttle bit of tachycardia"  . Radius fracture    displaced right distal radius fracture  . Wears glasses     Past Surgical History:  Procedure Laterality Date  . CARPAL TUNNEL RELEASE     left hand  . ORIF WRIST FRACTURE Right 09/21/2019   Procedure: Right distal radius open reduction, internal fixation and surgery as indicated;  Surgeon: Ernest Mallick, MD;  Location: [facility] OR;  Service: Orthopedics;  Laterality: Right;     There were no vitals filed for this visit.  Subjective Assessment - 10/17/19 0724    Subjective   Pt reports pain in right wrist with movements    Patient Stated Goals  regain use of RUE    Currently in Pain?  Yes    Pain Score  7     Pain Location  Wrist    Pain Orientation  Right    Pain Descriptors / Indicators  Aching    Pain Type  Acute pain    Pain Onset  More than a month ago    Pain Frequency  Intermittent    Aggravating Factors   movement    Pain Relieving Factors  rest        [facility] OT Assessment - 10/17/19 0730      Assessment   Medical Diagnosis  colles' fx RUE( fx of right radius)    Referring Provider (OT)  Dr.  Roney Mans    Onset Date/Surgical Date  09/20/19      Precautions   Precaution Comments  cleared for A/ROM, AA/ROM and P/ROM, pt has wrist brace, therapist instructed pt to wear when she leaves the house or in between exercises if she is using her hands for protection.      Home  Environment   Family/patient expects to be discharged to:  Private residence    Lives With  Family      Prior Function   Level of Independence  Independent      ADL   ADL comments  modified independent using LUE      Mobility   Mobility Status  Independent      Written Expression   Dominant Hand  Right      Sensation   Light Touch  Appears Intact      Coordination   Fine Motor Movements are Fluid and Coordinated  No      Edema   Edema  moderate in R wrist and hand, steristrips in place over incision, no s/s of infection  ROM / Strength   AROM / PROM / Strength  AROM      AROM   Overall AROM   Deficits    Overall AROM Comments  grossly30% finger flexion , able to oppse digits 1, 2   deficits   AROM Assessment Site  Wrist    Right Forearm Supination  40 Degrees    Right Wrist Extension  25 Degrees    Right Wrist Flexion  35 Degrees    Right Wrist Radial Deviation  10 Degrees    Right Wrist Ulnar Deviation  15 Degrees      Hand Function   Right Hand Grip (lbs)  not tested due to prec                      OT Education - 10/17/19 1049    Education Details  inital A/ROM exercises: wrist flexion/ extension, forearm supination / pornation, finger flexion, hook fist, roof, and retrograde massage. Pt's sister took video, pt was instructed that she may use ice pack for 8-10 mins after exercise    Person(s) Educated  Patient;Caregiver(s)   interpreter via [employer]   Methods  Explanation;Demonstration;Verbal cues    Comprehension  Verbalized understanding;Returned demonstration;Verbal cues required   sister videpotaped      OT Short Term Goals - 10/17/19 1056      OT SHORT  TERM GOAL #1   Title  I with inital HEP.    Time  6    Period  Weeks    Status  New    Target Date  12/01/19      OT SHORT TERM GOAL #2   Title  Pt will be I with edema control measures    Time  6    Period  Weeks    Status  New      OT SHORT TERM GOAL #3   Title  Pt will demonstrate at least 80% composite finger flexion for functional use of RUE.    Time  6    Period  Weeks    Status  New      OT SHORT TERM GOAL #4   Title  Pt will demonstrate wrist flexion/ extension WFLS for ADLs.    Time  6    Period  Weeks    Status  New      OT SHORT TERM GOAL #5   Title  Pt will demonstrate atleast 80* supination/ pronation in prep for functional use of RUE.    Time  6    Period  Weeks    Status  New        OT Long Term Goals - 10/17/19 1058      OT LONG TERM GOAL #1   Title  I with updated HEP.    Time  12    Period  Weeks    Status  New    Target Date  01/15/20      OT LONG TERM GOAL #2   Title  Pt will resume use of RUE as her dominant hand at least 75% of the time with pain less than or equal to 3/10.    Time  12    Period  Weeks    Status  New      OT LONG TERM GOAL #3   Title  Pt will demonstrate RUE grip strength of at least 20 lbs for increased functional use.    Time  12    Period  Weeks    Status  New            Plan - 10/17/19 1040    Clinical Impression Statement  Pt is a 69 y.o female s/p ORIF  colles' fx right wrist by Dr. Jeannie Fend on 09/20/19.Pt is from [place] and is visiting her sister. Pt. is Spanish speaking. Pt presents with the following deficits: deccreased strength, decreased ROM, decreased functional use, pain and edema which impedes performance of ADLS/ IADLS. Pt can benefit from skilled occupational therapy to address these deficits in order to maximize pt's safety and I with daily activitiesPMH significant for CTR    OT Occupational Profile and History  Problem Focused Assessment - Including review of records relating to presenting  problem    Occupational performance deficits (Please refer to evaluation for details):  ADL's;IADL's;Rest and Sleep;Leisure;Social Participation    Body Structure / Function / Physical Skills  ADL;Edema;Strength;Endurance;UE functional use;Flexibility;FMC;Pain;Coordination;Decreased knowledge of precautions;GMC;ROM;Decreased knowledge of use of DME;Dexterity;IADL;Sensation    Rehab Potential  Good    Clinical Decision Making  Limited treatment options, no task modification necessary    Comorbidities Affecting Occupational Performance:  May have comorbidities impacting occupational performance    Modification or Assistance to Complete Evaluation   No modification of tasks or assist necessary to complete eval    OT Frequency  2x / week    OT Duration  12 weeks    OT Treatment/Interventions  Self-care/ADL training;Therapeutic exercise;Splinting;Manual Therapy;Ultrasound;Neuromuscular education;Therapeutic activities;DME and/or AE instruction;Paraffin;Cryotherapy;Electrical Stimulation;Fluidtherapy;Scar mobilization;Patient/family education;Passive range of motion;Contrast Bath;Moist Heat    Plan  issue formal HEP(pt's sister took videos on initital visit, issue handouts next visit)    OT Home Exercise Plan  A/RPM wrist flexion, extension forearm supination/ pronation, finger flexion/ extension(hook fist, roof) and retrograde massage.    Consulted and Agree with Plan of Care  Patient;Family member/caregiver    Family Member Consulted  sister, [place] interpreter       Patient will benefit from skilled therapeutic intervention in order to improve the following deficits and impairments:   Body Structure / Function / Physical Skills: ADL, Edema, Strength, Endurance, UE functional use, Flexibility, FMC, Pain, Coordination, Decreased knowledge of precautions, GMC, ROM, Decreased knowledge of use of DME, Dexterity, IADL, Sensation       Visit Diagnosis: Pain in right wrist  Stiffness of right wrist,  not elsewhere classified  Muscle weakness (generalized)  Other lack of coordination    Problem List There are no problems to display for this patient.   Bianca Johnson 10/17/2019, 12:07 PM  [place] [place] [address] [place], [place], [address] Phone: [phone]   Fax:  [phone]  Name: Bianca Johnson MRN: [id number] Date of Birth: 04/20/1951

## 2019-10-25 ENCOUNTER — Emergency Department (HOSPITAL_COMMUNITY): Admission: EM | Admit: 2019-10-25 | Discharge: 2019-10-25 | Discharge status: Absent without leave | Payer: Self-pay

## 2019-10-25 ENCOUNTER — Other Ambulatory Visit: Payer: Self-pay

## 2019-11-06 ENCOUNTER — Ambulatory Visit: Payer: Self-pay | Admitting: Occupational Therapy

## 2019-11-07 ENCOUNTER — Ambulatory Visit: Payer: Self-pay | Admitting: Occupational Therapy

## 2019-11-07 ENCOUNTER — Other Ambulatory Visit: Payer: Self-pay

## 2019-11-07 ENCOUNTER — Encounter: Payer: Self-pay | Admitting: Occupational Therapy

## 2019-11-07 DIAGNOSIS — M6281 Muscle weakness (generalized): Secondary | ICD-10-CM

## 2019-11-07 DIAGNOSIS — R278 Other lack of coordination: Secondary | ICD-10-CM

## 2019-11-07 DIAGNOSIS — M25531 Pain in right wrist: Secondary | ICD-10-CM

## 2019-11-07 DIAGNOSIS — M25631 Stiffness of right wrist, not elsewhere classified: Secondary | ICD-10-CM

## 2019-11-07 NOTE — Patient Instructions (Addendum)
  1. Grip Strengthening (Resistive Putty)   Squeeze putty using thumb and all fingers. Repeat _20___ times. Do __2__ sessions per day.   2. Roll putty into tube on table and pinch between each finger and thumb x 10 reps each. (can do ring and small finger together)     Copyright  VHI. All rights reserved.      Flexor Tendon Gliding (Active Hook Fist)   With fingers and knuckles straight, bend middle and tip joints. Do not bend large knuckles. Repeat _10-15___ times. Do _4-6___ sessions per day.  MP Flexion (Active)   With back of hand on table, bend large knuckles as far as they will go, keeping small joints straight. Repeat _10-15___ times. Do __4-6__ sessions per day. Activity: Reach into a narrow container.*      Finger Flexion / Extension   With palm up, bend fingers of left hand toward palm, making a  fist. Straighten fingers, opening fist. Repeat sequence _10-15___ times per session. Do _4-6__ sessions per day. Hand Variation: Palm down   Copyright  VHI. All rights reserved.   AROM: Wrist Extension   .  With ____ palm down, bend wrist up. Repeat __15__ times per set.  Do __4-6__ sessions per day.    AROM: Wrist Flexion   With_____ palm up, bend wrist up. Repeat __15__ times per set.  Do _4-6___ sessions per day.   AROM: Forearm Pronation / Supination   With ____ arm in handshake position, slowly rotate palm down until stretch is felt. Relax. Then rotate palm up until stretch is felt. Repeat _15___ times per set. Do _4-6___ sessions per day.  Copyright  VHI. All rights reserved.    PROM: Wrist Flexion / Extension   Grasp  hand and slowly bend wrist until stretch is felt. Relax. Then stretch as far as possible in opposite direction. Be sure to keep elbow bent.  Hold __10__ sec. each way Repeat _5___ times per set.    Do _4-6___ sessions per day.  Pronation (Passive)   Keep elbow bent at right angle and held firmly to side. Use other  hand to turn forearm until palm faces downward. Hold _10___ seconds. Repeat __5__ times. Do _4-6___ sessions per day.  Supination (Passive)   Keep elbow bent at right angle and held firmly at side. Use other hand to turn forearm until palm faces upward. Hold __10__ seconds. Repeat __5__ times. Do _4-6___ sessions per day.  Copyright  VHI. All rights reserved.  PROM: Wrist Flexion / Extension   Grasp  hand and slowly bend wrist until stretch is felt. Relax. Then stretch as far as possible in opposite direction. Be sure to keep elbow bent.  Hold __10__ sec. each way Repeat _5___ times per set.    Do _4-6___ sessions per day.  Pronation (Passive)   Keep elbow bent at right angle and held firmly to side. Use other hand to turn forearm until palm faces downward. Hold _10___ seconds. Repeat __5__ times. Do _4-6___ sessions per day.  Supination (Passive)   Keep elbow bent at right angle and held firmly at side. Use other hand to turn forearm until palm faces upward. Hold __10__ seconds. Repeat __5__ times. Do _4-6___ sessions per day.  Copyright  VHI. All rights reserved.

## 2019-11-07 NOTE — Therapy (Signed)
[facility] [address] [place], [place], [address] Phone: [phone]   Fax:  [phone]  Occupational Therapy Treatment  Patient Details  Name: Bianca Johnson MRN: [id number] Date of Birth: 07-16-1950 Referring Provider (OT): Dr. Roney Mans   Encounter Date: 11/07/2019  OT End of Session - 11/07/19 1121    Visit Number  2    Number of Visits  25    Date for OT Re-Evaluation  01/15/20    Authorization Type  self pay    Authorization Time Period  90 days, may d/c sooner dependent on pt progress.    OT Start Time  0935    OT Stop Time  1023    OT Time Calculation (min)  48 min    Activity Tolerance  Patient tolerated treatment well    Behavior During Therapy  WFL for tasks assessed/performed       Past Medical History:  Diagnosis Date  . Complication of anesthesia    Pt stated that when she get anesthesia she " gets a lttle bit of tachycardia"  . Radius fracture    displaced right distal radius fracture  . Wears glasses     Past Surgical History:  Procedure Laterality Date  . CARPAL TUNNEL RELEASE     left hand  . ORIF WRIST FRACTURE Right 09/21/2019   Procedure: Right distal radius open reduction, internal fixation and surgery as indicated;  Surgeon: Ernest Mallick, MD;  Location: [facility] OR;  Service: Orthopedics;  Laterality: Right;     There were no vitals filed for this visit.  Subjective Assessment - 11/07/19 1002    Subjective   Pt reports wrist and shoulder pain    Limitations  Pt is cleared for A/ROM , P/ROM and strengthening per Dr. Roney Mans on 11/01/19    Patient Stated Goals  regain use of RUE    Currently in Pain?  Yes    Pain Score  5     Pain Location  Arm    Pain Orientation  Right    Pain Descriptors / Indicators  Aching    Pain Type  Acute pain    Pain Onset  More than a month ago    Pain Frequency  Intermittent    Aggravating Factors   movement    Pain Relieving Factors   rest           Treatment: Fluidotherapy x 8 mins to RUE for pain and stiffness, no adverse reactions. Therapist reviewed A/ROM, and added P/ROM exercises to HEP as well as yellow putty HEP. Ice pack at end of session x 8 mins for pain relief, no adverse reactions.                OT Education - 11/07/19 1008    Education Details  A/ROM, P/ROM exercises, see pt instructions, yellow putty, pt's sister videotaped exercises    Person(s) Educated  Patient   siter, interpreter present   Methods  Explanation;Demonstration;Verbal cues;Handout    Comprehension  Verbalized understanding;Returned demonstration;Verbal cues required       OT Short Term Goals - 10/17/19 1056      OT SHORT TERM GOAL #1   Title  I with inital HEP.    Time  6    Period  Weeks    Status  New    Target Date  12/01/19      OT SHORT TERM GOAL #2   Title  Pt will be I with  edema control measures    Time  6    Period  Weeks    Status  New      OT SHORT TERM GOAL #3   Title  Pt will demonstrate at least 80% composite finger flexion for functional use of RUE.    Time  6    Period  Weeks    Status  New      OT SHORT TERM GOAL #4   Title  Pt will demonstrate wrist flexion/ extension WFLS for ADLs.    Time  6    Period  Weeks    Status  New      OT SHORT TERM GOAL #5   Title  Pt will demonstrate atleast 80* supination/ pronation in prep for functional use of RUE.    Time  6    Period  Weeks    Status  New        OT Long Term Goals - 10/17/19 1058      OT LONG TERM GOAL #1   Title  I with updated HEP.    Time  12    Period  Weeks    Status  New    Target Date  01/15/20      OT LONG TERM GOAL #2   Title  Pt will resume use of RUE as her dominant hand at least 75% of the time with pain less than or equal to 3/10.    Time  12    Period  Weeks    Status  New      OT LONG TERM GOAL #3   Title  Pt will demonstrate RUE grip strength of at least 20 lbs for increased functional use.     Time  12    Period  Weeks    Status  New            Plan - 11/07/19 1127    Clinical Impression Statement  Pt is progressing towards goals. Pt remains limited by pain and stiffness.    OT Occupational Profile and History  Problem Focused Assessment - Including review of records relating to presenting problem    Occupational performance deficits (Please refer to evaluation for details):  ADL's;IADL's;Rest and Sleep;Leisure;Social Participation    Body Structure / Function / Physical Skills  ADL;Edema;Strength;Endurance;UE functional use;Flexibility;FMC;Pain;Coordination;Decreased knowledge of precautions;GMC;ROM;Decreased knowledge of use of DME;Dexterity;IADL;Sensation    Rehab Potential  Good    Clinical Decision Making  Limited treatment options, no task modification necessary    Comorbidities Affecting Occupational Performance:  May have comorbidities impacting occupational performance    Modification or Assistance to Complete Evaluation   No modification of tasks or assist necessary to complete eval    OT Frequency  2x / week    OT Duration  12 weeks    OT Treatment/Interventions  Self-care/ADL training;Therapeutic exercise;Splinting;Manual Therapy;Ultrasound;Neuromuscular education;Therapeutic activities;DME and/or AE instruction;Paraffin;Cryotherapy;Electrical Stimulation;Fluidtherapy;Scar mobilization;Patient/family education;Passive range of motion;Contrast Bath;Moist Heat    Plan  continue with A/ROM, P/ROM and gentle strengthening    OT Home Exercise Plan  A/ROM wrist flexion, extension forearm supination/ pronation, finger flexion/ extension(hook fist, roof) and retrograde massage.    Consulted and Agree with Plan of Care  Patient;Family member/caregiver    Family Member Consulted  sister, interpreter       Patient will benefit from skilled therapeutic intervention in order to improve the following deficits and impairments:   Body Structure / Function / Physical Skills:  ADL, Edema, Strength, Endurance, UE functional  use, Flexibility, FMC, Pain, Coordination, Decreased knowledge of precautions, GMC, ROM, Decreased knowledge of use of DME, Dexterity, IADL, Sensation       Visit Diagnosis: Pain in right wrist  Stiffness of right wrist, not elsewhere classified  Muscle weakness (generalized)  Other lack of coordination    Problem List There are no problems to display for this patient.   Sher Hellinger 11/07/2019, 11:31 AM Theone Murdoch, OTR/L [phone] Phone: [phone] 11:38 AM 11/07/19 [facility] [address] [place], [place], [address] Phone: [phone]   Fax:  [phone]  Name: Alencia Gordon MRN: [id number] Date of Birth: June 13, 1951

## 2019-11-14 ENCOUNTER — Other Ambulatory Visit: Payer: Self-pay

## 2019-11-14 ENCOUNTER — Ambulatory Visit: Payer: Self-pay | Attending: Orthopaedic Surgery | Admitting: Occupational Therapy

## 2019-11-14 DIAGNOSIS — M6281 Muscle weakness (generalized): Secondary | ICD-10-CM | POA: Insufficient documentation

## 2019-11-14 DIAGNOSIS — R278 Other lack of coordination: Secondary | ICD-10-CM | POA: Insufficient documentation

## 2019-11-14 DIAGNOSIS — M25631 Stiffness of right wrist, not elsewhere classified: Secondary | ICD-10-CM | POA: Insufficient documentation

## 2019-11-14 DIAGNOSIS — M25531 Pain in right wrist: Secondary | ICD-10-CM | POA: Insufficient documentation

## 2019-11-14 DIAGNOSIS — R600 Localized edema: Secondary | ICD-10-CM | POA: Insufficient documentation

## 2019-11-14 NOTE — Therapy (Signed)
[facility] [address] [place], [place], [address] Phone: [phone]   Fax:  [phone]  Occupational Therapy Treatment  Patient Details  Name: Bianca Johnson MRN: [id number] Date of Birth: 1950/11/25 Referring Provider (OT): Dr. Roney Mans   Encounter Date: 11/14/2019  OT End of Session - 11/14/19 0857    Visit Number  3    Number of Visits  25    Date for OT Re-Evaluation  01/15/20    Authorization Type  self pay    Authorization Time Period  90 days, may d/c sooner dependent on pt progress.    OT Start Time  0800    OT Stop Time  0850    OT Time Calculation (min)  50 min    Activity Tolerance  Patient tolerated treatment well    Behavior During Therapy  WFL for tasks assessed/performed       Past Medical History:  Diagnosis Date  . Complication of anesthesia    Pt stated that when she get anesthesia she " gets a lttle bit of tachycardia"  . Radius fracture    displaced right distal radius fracture  . Wears glasses     Past Surgical History:  Procedure Laterality Date  . CARPAL TUNNEL RELEASE     left hand  . ORIF WRIST FRACTURE Right 09/21/2019   Procedure: Right distal radius open reduction, internal fixation and surgery as indicated;  Surgeon: Ernest Mallick, MD;  Location: [facility] OR;  Service: Orthopedics;  Laterality: Right;     There were no vitals filed for this visit.  Subjective Assessment - 11/14/19 0808    Limitations  Pt is cleared for A/ROM , P/ROM and strengthening per Dr. Roney Mans on 11/01/19    Patient Stated Goals  regain use of RUE    Currently in Pain?  Yes    Pain Score  --   fluctuates   Pain Location  Arm   up to shoulder   Pain Orientation  Right    Pain Descriptors / Indicators  Aching    Pain Type  Acute pain    Pain Onset  More than a month ago    Aggravating Factors   shoulder IR, making a fist    Pain Relieving Factors  rest       Pt had concerns with pain  in shoulder - recommended discussing further with MD at next appointment. Pt also had questions about HEP and spent majority of session reviewing HEP and having pt return demo for clarification. Interpreter present Fluidotherapy x 10 min to Rt hand/wrist while hot pack applied to Rt shoulder for pain/stiffness.                     OT Education - 11/14/19 0857    Education Details  Extensive review of HEP    Person(s) Educated  Patient;Other (comment)   SISTER (Interpreter present)      OT Short Term Goals - 10/17/19 1056      OT SHORT TERM GOAL #1   Title  I with inital HEP.    Time  6    Period  Weeks    Status  New    Target Date  12/01/19      OT SHORT TERM GOAL #2   Title  Pt will be I with edema control measures    Time  6    Period  Weeks    Status  New  OT SHORT TERM GOAL #3   Title  Pt will demonstrate at least 80% composite finger flexion for functional use of RUE.    Time  6    Period  Weeks    Status  New      OT SHORT TERM GOAL #4   Title  Pt will demonstrate wrist flexion/ extension WFLS for ADLs.    Time  6    Period  Weeks    Status  New      OT SHORT TERM GOAL #5   Title  Pt will demonstrate atleast 80* supination/ pronation in prep for functional use of RUE.    Time  6    Period  Weeks    Status  New        OT Long Term Goals - 10/17/19 1058      OT LONG TERM GOAL #1   Title  I with updated HEP.    Time  12    Period  Weeks    Status  New    Target Date  01/15/20      OT LONG TERM GOAL #2   Title  Pt will resume use of RUE as her dominant hand at least 75% of the time with pain less than or equal to 3/10.    Time  12    Period  Weeks    Status  New      OT LONG TERM GOAL #3   Title  Pt will demonstrate RUE grip strength of at least 20 lbs for increased functional use.    Time  12    Period  Weeks    Status  New            Plan - 11/14/19 0904    Clinical Impression Statement  Pt is progressing towards  goals. Pt remains limited by pain and stiffness. Pt reports pain in shoulder. Pt also stiff with wrist extension and full composite extension as well as end range pronation and supination.    Occupational performance deficits (Please refer to evaluation for details):  ADL's;IADL's;Rest and Sleep;Leisure;Social Participation    Body Structure / Function / Physical Skills  ADL;Edema;Strength;Endurance;UE functional use;Flexibility;FMC;Pain;Coordination;Decreased knowledge of precautions;GMC;ROM;Decreased knowledge of use of DME;Dexterity;IADL;Sensation    Rehab Potential  Good    OT Frequency  2x / week    OT Duration  12 weeks    OT Treatment/Interventions  Self-care/ADL training;Therapeutic exercise;Splinting;Manual Therapy;Ultrasound;Neuromuscular education;Therapeutic activities;DME and/or AE instruction;Paraffin;Cryotherapy;Electrical Stimulation;Fluidtherapy;Scar mobilization;Patient/family education;Passive range of motion;Contrast Bath;Moist Heat    Plan  add gentle wrist strengthening as tolerated    Consulted and Agree with Plan of Care  Patient;Family member/caregiver    Family Member Consulted  sister, interpreter       Patient will benefit from skilled therapeutic intervention in order to improve the following deficits and impairments:   Body Structure / Function / Physical Skills: ADL, Edema, Strength, Endurance, UE functional use, Flexibility, FMC, Pain, Coordination, Decreased knowledge of precautions, GMC, ROM, Decreased knowledge of use of DME, Dexterity, IADL, Sensation       Visit Diagnosis: Pain in right wrist  Stiffness of right wrist, not elsewhere classified  Muscle weakness (generalized)    Problem List There are no problems to display for this patient.   Kelli Churn, OTR/L 11/14/2019, 9:10 AM  [facility] [address] [place], [place], [address] Phone: [phone]   Fax:   [phone]  Name: Khadeja Abt MRN: [id number] Date of  Birth: 1951/01/31

## 2019-11-16 ENCOUNTER — Other Ambulatory Visit: Payer: Self-pay

## 2019-11-16 ENCOUNTER — Ambulatory Visit: Payer: Self-pay | Admitting: Occupational Therapy

## 2019-11-16 ENCOUNTER — Encounter: Payer: Self-pay | Admitting: Occupational Therapy

## 2019-11-16 DIAGNOSIS — R600 Localized edema: Secondary | ICD-10-CM

## 2019-11-16 DIAGNOSIS — M25631 Stiffness of right wrist, not elsewhere classified: Secondary | ICD-10-CM

## 2019-11-16 DIAGNOSIS — M6281 Muscle weakness (generalized): Secondary | ICD-10-CM

## 2019-11-16 DIAGNOSIS — M25531 Pain in right wrist: Secondary | ICD-10-CM

## 2019-11-16 DIAGNOSIS — R278 Other lack of coordination: Secondary | ICD-10-CM

## 2019-11-16 NOTE — Therapy (Signed)
[facility] [address] [place], [place], [address] Phone: [phone]   Fax:  [phone]  Occupational Therapy Treatment  Patient Details  Name: Bianca Johnson MRN: [id number] Date of Birth: [birth year] Referring Provider (OT): Dr. Roney Mans   Encounter Date: 11/16/2019  OT End of Session - 11/16/19 0912    Visit Number  4    Number of Visits  25    Date for OT Re-Evaluation  01/15/20    Authorization Type  self pay    Authorization Time Period  90 days, may d/c sooner dependent on pt progress.    OT Start Time  [phone]   pt was not arrived by front until after therapy was started   OT Stop Time  0930    OT Time Calculation (min)  40 min    Activity Tolerance  Patient tolerated treatment well    Behavior During Therapy  [facility] for tasks assessed/performed       Past Medical History:  Diagnosis Date  . Complication of anesthesia    Pt stated that when she get anesthesia she " gets a lttle bit of tachycardia"  . Radius fracture    displaced right distal radius fracture  . Wears glasses     Past Surgical History:  Procedure Laterality Date  . CARPAL TUNNEL RELEASE     left hand  . ORIF WRIST FRACTURE Right 09/21/2019   Procedure: Right distal radius open reduction, internal fixation and surgery as indicated;  Surgeon: Ernest Mallick, MD;  Location: [facility] OR;  Service: Orthopedics;  Laterality: Right;     There were no vitals filed for this visit.  Subjective Assessment - 11/16/19 0904    Subjective   wrist and shoulder pain    Limitations  Pt is cleared for A/ROM , P/ROM and strengthening per Dr. Roney Mans on 11/01/19    Patient Stated Goals  regain use of RUE    Currently in Pain?  Yes    Pain Score  3     Pain Location  Wrist    Pain Orientation  Right    Pain Descriptors / Indicators  Aching    Pain Type  Acute pain    Pain Onset  More than a month ago    Pain Frequency  Intermittent    Aggravating Factors   movement    Pain Relieving Factors  rest              Treatment:Fluidotherapy x 5 mins for pain and stiffness, fluido was discontinued at 5 mins due to pt reporting it was too warm. A/ROM wrist flexion/ extension supination/pronation followed by P/ROM A/ROM IP, MP and composite flexion followed by upgraded putty exercises with red putty for sustained grip and pinch , min v.c Supination/ pronation with forearm roller, min v.c for positioning, forearm gym x 4 reps for increased A/ROM Functional reaching with sustained pinch to place graded clothespins on vertical antennae, min v.c               OT Short Term Goals - 10/17/19 1056      OT SHORT TERM GOAL #1   Title  I with inital HEP.    Time  6    Period  Weeks    Status  New    Target Date  12/01/19      OT SHORT TERM GOAL #2   Title  Pt will be I with edema control measures    Time  6    Period  Weeks    Status  New      OT SHORT TERM GOAL #3   Title  Pt will demonstrate at least 80% composite finger flexion for functional use of RUE.    Time  6    Period  Weeks    Status  New      OT SHORT TERM GOAL #4   Title  Pt will demonstrate wrist flexion/ extension WFLS for ADLs.    Time  6    Period  Weeks    Status  New      OT SHORT TERM GOAL #5   Title  Pt will demonstrate atleast 80* supination/ pronation in prep for functional use of RUE.    Time  6    Period  Weeks    Status  New        OT Long Term Goals - 10/17/19 1058      OT LONG TERM GOAL #1   Title  I with updated HEP.    Time  12    Period  Weeks    Status  New    Target Date  01/15/20      OT LONG TERM GOAL #2   Title  Pt will resume use of RUE as her dominant hand at least 75% of the time with pain less than or equal to 3/10.    Time  12    Period  Weeks    Status  New      OT LONG TERM GOAL #3   Title  Pt will demonstrate RUE grip strength of at least 20 lbs for increased functional use.    Time  12     Period  Weeks    Status  New            Plan - 11/16/19 0919    Clinical Impression Statement  Pt is progressing towards goals. Pt remains limited by pain and stiffness. Pt reports pain in shoulder. Therapist encouraged pt to discuss shoulder pain with her MD    Occupational performance deficits (Please refer to evaluation for details):  ADL's;IADL's;Rest and Sleep;Leisure;Social Participation    Body Structure / Function / Physical Skills  ADL;Edema;Strength;Endurance;UE functional use;Flexibility;FMC;Pain;Coordination;Decreased knowledge of precautions;GMC;ROM;Decreased knowledge of use of DME;Dexterity;IADL;Sensation    Rehab Potential  Good    OT Frequency  2x / week    OT Duration  12 weeks    OT Treatment/Interventions  Self-care/ADL training;Therapeutic exercise;Splinting;Manual Therapy;Ultrasound;Neuromuscular education;Therapeutic activities;DME and/or AE instruction;Paraffin;Cryotherapy;Electrical Stimulation;Fluidtherapy;Scar mobilization;Patient/family education;Passive range of motion;Contrast Bath;Moist Heat    Plan  add gentle wrist strengthening as tolerated at 8 weeks post op    Consulted and Agree with Plan of Care  Patient;Family member/caregiver    Family Member Consulted  sister, interpreter       Patient will benefit from skilled therapeutic intervention in order to improve the following deficits and impairments:   Body Structure / Function / Physical Skills: ADL, Edema, Strength, Endurance, UE functional use, Flexibility, FMC, Pain, Coordination, Decreased knowledge of precautions, GMC, ROM, Decreased knowledge of use of DME, Dexterity, IADL, Sensation       Visit Diagnosis: Pain in right wrist  Stiffness of right wrist, not elsewhere classified  Muscle weakness (generalized)  Other lack of coordination  Localized edema    Problem List There are no problems to display for this patient.   Ajani Rineer 11/16/2019, 9:23 AM  [facility] [address]  [address] [place], [place], [address] Phone: [phone]   Fax:  [phone]  Name: Bianca Johnson MRN: [id number] Date of Birth: Apr 10, 1951

## 2019-11-21 ENCOUNTER — Other Ambulatory Visit: Payer: Self-pay

## 2019-11-21 ENCOUNTER — Ambulatory Visit: Payer: Self-pay | Admitting: Occupational Therapy

## 2019-11-21 ENCOUNTER — Encounter: Payer: Self-pay | Admitting: Occupational Therapy

## 2019-11-21 DIAGNOSIS — R278 Other lack of coordination: Secondary | ICD-10-CM

## 2019-11-21 DIAGNOSIS — M25531 Pain in right wrist: Secondary | ICD-10-CM

## 2019-11-21 DIAGNOSIS — R600 Localized edema: Secondary | ICD-10-CM

## 2019-11-21 DIAGNOSIS — M25631 Stiffness of right wrist, not elsewhere classified: Secondary | ICD-10-CM

## 2019-11-21 DIAGNOSIS — M6281 Muscle weakness (generalized): Secondary | ICD-10-CM

## 2019-11-21 NOTE — Therapy (Signed)
[facility] [address] [place], [place], [address] Phone: [phone]   Fax:  [phone]  Occupational Therapy Treatment  Patient Details  Name: Bianca Johnson MRN: [id number] Date of Birth: 10-Jul-1950 Referring Provider (OT): Dr. Jeannie Fend   Encounter Date: 11/21/2019  OT End of Session - 11/21/19 0726    Visit Number  5    Number of Visits  25    Date for OT Re-Evaluation  01/15/20    Authorization Type  self pay    Authorization Time Period  90 days, may d/c sooner dependent on pt progress.    OT Start Time  [phone]    OT Stop Time  0758    OT Time Calculation (min)  40 min    Activity Tolerance  Patient tolerated treatment well    Behavior During Therapy  [facility] for tasks assessed/performed       Past Medical History:  Diagnosis Date  . Complication of anesthesia    Pt stated that when she get anesthesia she " gets a lttle bit of tachycardia"  . Radius fracture    displaced right distal radius fracture  . Wears glasses     Past Surgical History:  Procedure Laterality Date  . CARPAL TUNNEL RELEASE     left hand  . ORIF WRIST FRACTURE Right 09/21/2019   Procedure: Right distal radius open reduction, internal fixation and surgery as indicated;  Surgeon: Verner Mould, MD;  Location: [facility];  Service: Orthopedics;  Laterality: Right;  59min    There were no vitals filed for this visit.  Subjective Assessment - 11/21/19 0725    Subjective   It is the same    Limitations  Pt is cleared for A/ROM , P/ROM and strengthening per Dr. Jeannie Fend on 11/01/19    Patient Stated Goals  regain use of RUE    Currently in Pain?  No/denies    Pain Onset  More than a month ago            Treatment:Fluidotherapy 10 mins for pain and stiffness,max temp 105, no adverse reactions P/ROM wrist flexion/ extension then supination/ pronation Followed by education regarding strengthening HEP, see pt instructions, min v.c  via interpreter                  OT Short Term Goals - 10/17/19 1056      OT [place] #1   Title  I with inital HEP.    Time  6    Period  Weeks    Status  New    Target Date  12/01/19      OT SHORT TERM GOAL #2   Title  Pt will be I with edema control measures    Time  6    Period  Weeks    Status  New      OT SHORT TERM GOAL #3   Title  Pt will demonstrate at least 80% composite finger flexion for functional use of RUE.    Time  6    Period  Weeks    Status  New      OT SHORT TERM GOAL #4   Title  Pt will demonstrate wrist flexion/ extension WFLS for ADLs.    Time  6    Period  Weeks    Status  New      OT SHORT TERM GOAL #5   Title  Pt will demonstrate atleast 80* supination/ pronation in  prep for functional use of RUE.    Time  6    Period  Weeks    Status  New        OT Long Term Goals - 10/17/19 1058      OT LONG TERM GOAL #1   Title  I with updated HEP.    Time  12    Period  Weeks    Status  New    Target Date  01/15/20      OT LONG TERM GOAL #2   Title  Pt will resume use of RUE as her dominant hand at least 75% of the time with pain less than or equal to 3/10.    Time  12    Period  Weeks    Status  New      OT LONG TERM GOAL #3   Title  Pt will demonstrate RUE grip strength of at least 20 lbs for increased functional use.    Time  12    Period  Weeks    Status  New            Plan - 11/21/19 6222    Clinical Impression Statement  Pt is progressing towards goals with improving ROM.    Occupational performance deficits (Please refer to evaluation for details):  ADL's;IADL's;Rest and Sleep;Leisure;Social Participation    Body Structure / Function / Physical Skills  ADL;Edema;Strength;Endurance;UE functional use;Flexibility;FMC;Pain;Coordination;Decreased knowledge of precautions;GMC;ROM;Decreased knowledge of use of DME;Dexterity;IADL;Sensation    Rehab Potential  Good    OT Frequency  2x / week    OT Duration  12  weeks    OT Treatment/Interventions  Self-care/ADL training;Therapeutic exercise;Splinting;Manual Therapy;Ultrasound;Neuromuscular education;Therapeutic activities;DME and/or AE instruction;Paraffin;Cryotherapy;Electrical Stimulation;Fluidtherapy;Scar mobilization;Patient/family education;Passive range of motion;Contrast Bath;Moist Heat    Plan  add gentle wrist strengthening as tolerated at 8 weeks post op    Consulted and Agree with Plan of Care  Patient;Family member/caregiver    Family Member Consulted  sister, interpreter       Patient will benefit from skilled therapeutic intervention in order to improve the following deficits and impairments:   Body Structure / Function / Physical Skills: ADL, Edema, Strength, Endurance, UE functional use, Flexibility, FMC, Pain, Coordination, Decreased knowledge of precautions, GMC, ROM, Decreased knowledge of use of DME, Dexterity, IADL, Sensation       Visit Diagnosis: Pain in right wrist  Stiffness of right wrist, not elsewhere classified  Muscle weakness (generalized)  Other lack of coordination  Localized edema    Problem List There are no problems to display for this patient.   Bianca Johnson 11/21/2019, 7:34 AM  [facility] [address] [place], [place], [address] Phone: [phone]   Fax:  [phone]  Name: Bianca Johnson MRN: [id number] Date of Birth: 08-22-1950

## 2019-11-21 NOTE — Patient Instructions (Signed)
AROM: Wrist Extension   .  With right palm down, bend wrist up.hold 1 lbs weight  Repeat __10__ times per set.  Do __1-2__ sessions per day.    AROM: Wrist Flexion   With__right ___ palm up, bend wrist up. Hold  1 lbs weight in right hand or water bottle Repeat __10__ times per set.  Do _1-2__ sessions per day.   AROM: Forearm Pronation / Supination   With _right___ arm in handshake position, slowly rotate palm down until stretch is felt. Relax. Then rotate palm up until stretch is felt. Hold light hammer or 1 lbs weight Repeat _10___ times per set. Do _1-2__ sessions per day.  Copyright  VHI. All rights reserved.

## 2019-11-24 ENCOUNTER — Encounter: Payer: Self-pay | Admitting: Occupational Therapy

## 2019-11-24 ENCOUNTER — Ambulatory Visit: Payer: Self-pay | Admitting: Occupational Therapy

## 2019-11-24 ENCOUNTER — Other Ambulatory Visit: Payer: Self-pay

## 2019-11-24 DIAGNOSIS — R278 Other lack of coordination: Secondary | ICD-10-CM

## 2019-11-24 DIAGNOSIS — M6281 Muscle weakness (generalized): Secondary | ICD-10-CM

## 2019-11-24 DIAGNOSIS — M25631 Stiffness of right wrist, not elsewhere classified: Secondary | ICD-10-CM

## 2019-11-24 DIAGNOSIS — M25531 Pain in right wrist: Secondary | ICD-10-CM

## 2019-11-24 NOTE — Therapy (Signed)
[facility] [address] [place], [place], [address] Phone: [phone]   Fax:  [phone]  Occupational Therapy Treatment  Patient Details  Name: Bianca Johnson MRN: [id number] Date of Birth: Feb 27, 1951 Referring Provider (OT): Dr. Jeannie Fend   Encounter Date: 11/24/2019   OT End of Session - 11/24/19 0811    Visit Number 6    Number of Visits 25    Date for OT Re-Evaluation 01/15/20    Authorization Type self pay    Authorization Time Period 90 days, may d/c sooner dependent on pt progress.    OT Start Time 0802    Activity Tolerance Patient tolerated treatment well    Behavior During Therapy [facility] for tasks assessed/performed           Past Medical History:  Diagnosis Date  . Complication of anesthesia    Pt stated that when she get anesthesia she " gets a lttle bit of tachycardia"  . Radius fracture    displaced right distal radius fracture  . Wears glasses     Past Surgical History:  Procedure Laterality Date  . CARPAL TUNNEL RELEASE     left hand  . ORIF WRIST FRACTURE Right 09/21/2019   Procedure: Right distal radius open reduction, internal fixation and surgery as indicated;  Surgeon: Verner Mould, MD;  Location: [facility];  Service: Orthopedics;  Laterality: Right;  70min    There were no vitals filed for this visit.   Subjective Assessment - 11/24/19 0810    Subjective  Pt denies pain on arrival    Limitations Pt is cleared for A/ROM , P/ROM and strengthening per Dr. Jeannie Fend on 11/01/19    Patient Stated Goals regain use of RUE    Currently in Pain? No/denies                   Treatment: Treatment:Fluidotherapy 9 mins for pain and stiffness with pt performing A/ROM while in fluido,max temp 105, no adverse reactions P/ROM wrist flexion/ extension then supination/ pronation Followed by review of strengthening HEP,with 1 lbs weight for wrist flexion/ extension, supination/ pronation  with light hammer Tendon gliding A/ROM, min v.c and demo Reviewed red putty HEP for sustained grip and pinch min v.c to avoid shoulder hike Placing graded clothespins on vertical antennae with RUE for sustained pinch and functional reach, min v.c  Therapist checked short term goals.            OT Short Term Goals - 11/21/19 1221      OT SHORT TERM GOAL #1   Title I with inital HEP.    Time 6    Period Weeks    Status Achieved    Target Date 12/01/19      OT SHORT TERM GOAL #2   Title Pt will be I with edema control measures    Time 6    Period Weeks    Status New      OT SHORT TERM GOAL #3   Title Pt will demonstrate at least 80% composite finger flexion for functional use of RUE.    Time 6    Period Weeks    Status Achieved      OT SHORT TERM GOAL #4   Title Pt will demonstrate wrist flexion/ extension WFLS for ADLs.    Time 6    Period Weeks    Status On-going      OT SHORT TERM GOAL #5   Title Pt will  demonstrate atleast 80* supination/ pronation in prep for functional use of RUE.    Time 6    Period Weeks    Status On-going             OT Long Term Goals - 10/17/19 1058      OT LONG TERM GOAL #1   Title I with updated HEP.    Time 12    Period Weeks    Status New    Target Date 01/15/20      OT LONG TERM GOAL #2   Title Pt will resume use of RUE as her dominant hand at least 75% of the time with pain less than or equal to 3/10.    Time 12    Period Weeks    Status New      OT LONG TERM GOAL #3   Title Pt will demonstrate RUE grip strength of at least 20 lbs for increased functional use.    Time 12    Period Weeks    Status New                 Plan - 11/24/19 7989    Clinical Impression Statement Pt demonstrates understanding of gentle strengthening HEP for RUE.    Occupational performance deficits (Please refer to evaluation for details): ADL's;IADL's;Rest and Sleep;Leisure;Social Participation    Body Structure / Function /  Physical Skills ADL;Edema;Strength;Endurance;UE functional use;Flexibility;FMC;Pain;Coordination;Decreased knowledge of precautions;GMC;ROM;Decreased knowledge of use of DME;Dexterity;IADL;Sensation    Rehab Potential Good    OT Frequency 2x / week    OT Duration 12 weeks    OT Treatment/Interventions Self-care/ADL training;Therapeutic exercise;Splinting;Manual Therapy;Ultrasound;Neuromuscular education;Therapeutic activities;DME and/or AE instruction;Paraffin;Cryotherapy;Electrical Stimulation;Fluidtherapy;Scar mobilization;Patient/family education;Passive range of motion;Contrast Bath;Moist Heat    Plan continue with A/ROM, P/ROM and gentle strengthening    Consulted and Agree with Plan of Care Patient;Family member/caregiver    Family Member Consulted sister, interpreter           Patient will benefit from skilled therapeutic intervention in order to improve the following deficits and impairments:   Body Structure / Function / Physical Skills: ADL, Edema, Strength, Endurance, UE functional use, Flexibility, FMC, Pain, Coordination, Decreased knowledge of precautions, GMC, ROM, Decreased knowledge of use of DME, Dexterity, IADL, Sensation       Visit Diagnosis: Pain in right wrist  Stiffness of right wrist, not elsewhere classified  Muscle weakness (generalized)  Other lack of coordination    Problem List There are no problems to display for this patient.   Bianca Johnson 11/24/2019, 8:13 AM  [facility] [address] [place], [place], [address] Phone: [phone]   Fax:  [phone]  Name: Bianca Johnson MRN: [id number] Date of Birth: Jan 28, 1951

## 2019-11-27 ENCOUNTER — Ambulatory Visit: Payer: Self-pay | Admitting: Occupational Therapy

## 2019-11-28 ENCOUNTER — Encounter: Payer: Self-pay | Admitting: Occupational Therapy

## 2019-11-29 ENCOUNTER — Ambulatory Visit: Payer: Self-pay | Admitting: Occupational Therapy

## 2019-12-05 ENCOUNTER — Ambulatory Visit: Payer: Self-pay | Admitting: Occupational Therapy

## 2019-12-07 ENCOUNTER — Encounter: Payer: Self-pay | Admitting: Occupational Therapy

## 2019-12-12 ENCOUNTER — Encounter: Payer: Self-pay | Admitting: Occupational Therapy

## 2019-12-14 ENCOUNTER — Encounter: Payer: Self-pay | Admitting: Occupational Therapy

## 2019-12-19 ENCOUNTER — Encounter: Payer: Self-pay | Admitting: Occupational Therapy

## 2019-12-21 ENCOUNTER — Encounter: Payer: Self-pay | Admitting: Occupational Therapy

## 2019-12-26 ENCOUNTER — Encounter: Payer: Self-pay | Admitting: Occupational Therapy

## 2019-12-28 ENCOUNTER — Encounter: Payer: Self-pay | Admitting: Occupational Therapy

## 2020-01-02 ENCOUNTER — Encounter: Payer: Self-pay | Admitting: Occupational Therapy

## 2020-01-04 ENCOUNTER — Encounter: Payer: Self-pay | Admitting: Occupational Therapy

## 2020-01-09 ENCOUNTER — Encounter: Payer: Self-pay | Admitting: Occupational Therapy

## 2020-01-11 ENCOUNTER — Encounter: Payer: Self-pay | Admitting: Occupational Therapy

## 2021-08-02 IMAGING — DX DG WRIST COMPLETE 3+V*R*
2 series · 2 of 2 positions shown · non-contrast
Comparison: Films from earlier in the same day.

CLINICAL DATA: Status post reduction

EXAM:
RIGHT WRIST - COMPLETE 3+ VIEW

[wrist ap]
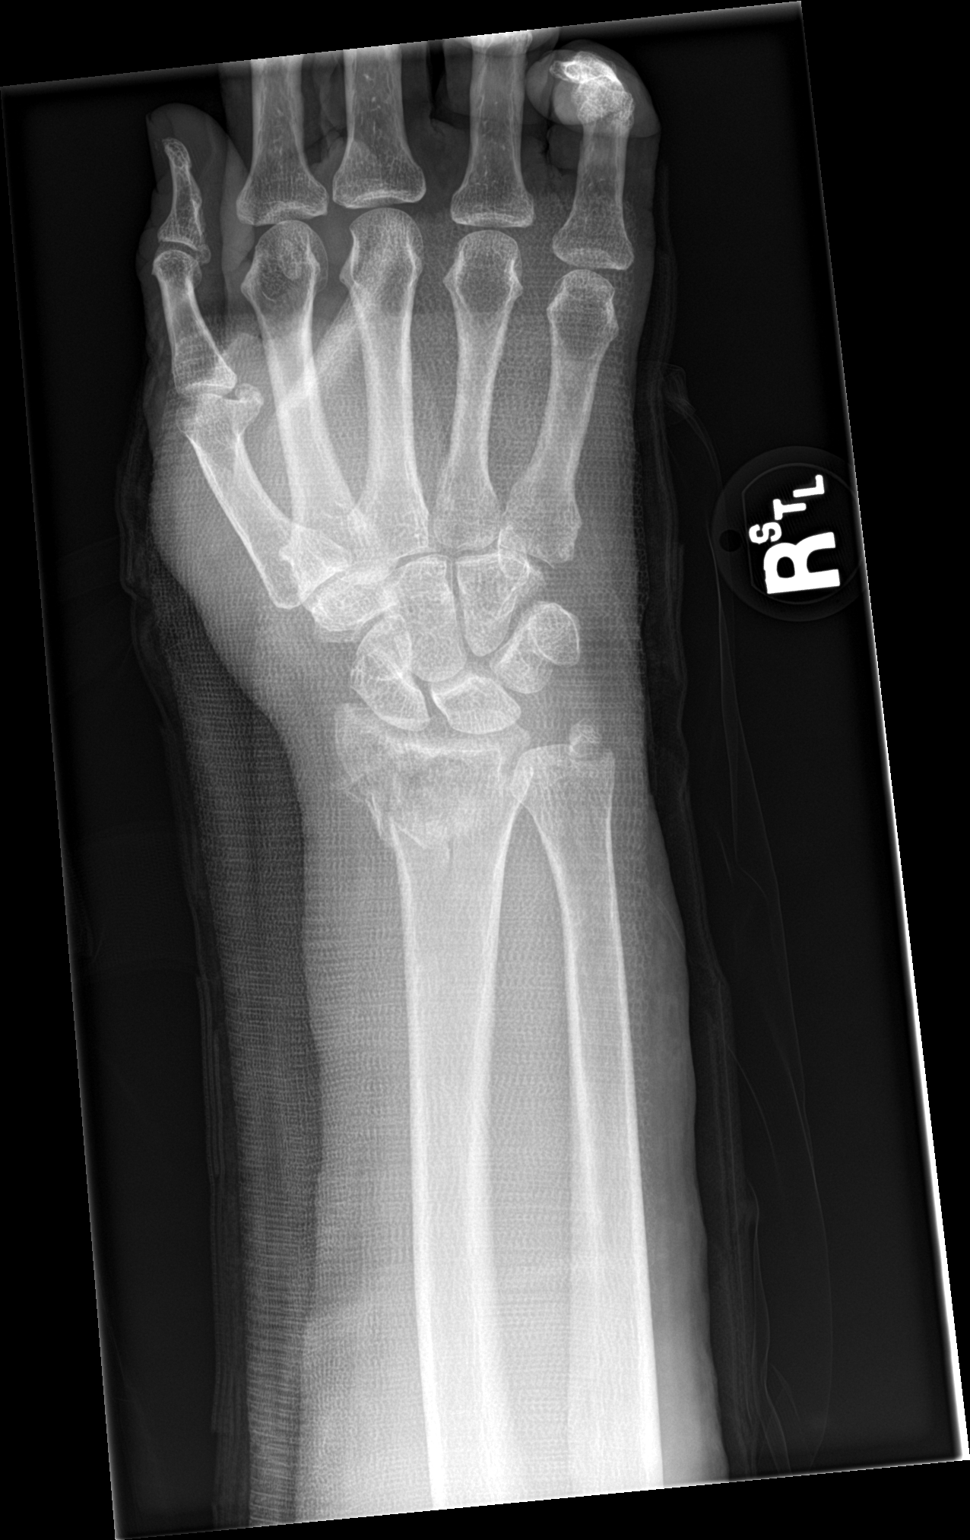

[wrist lat]
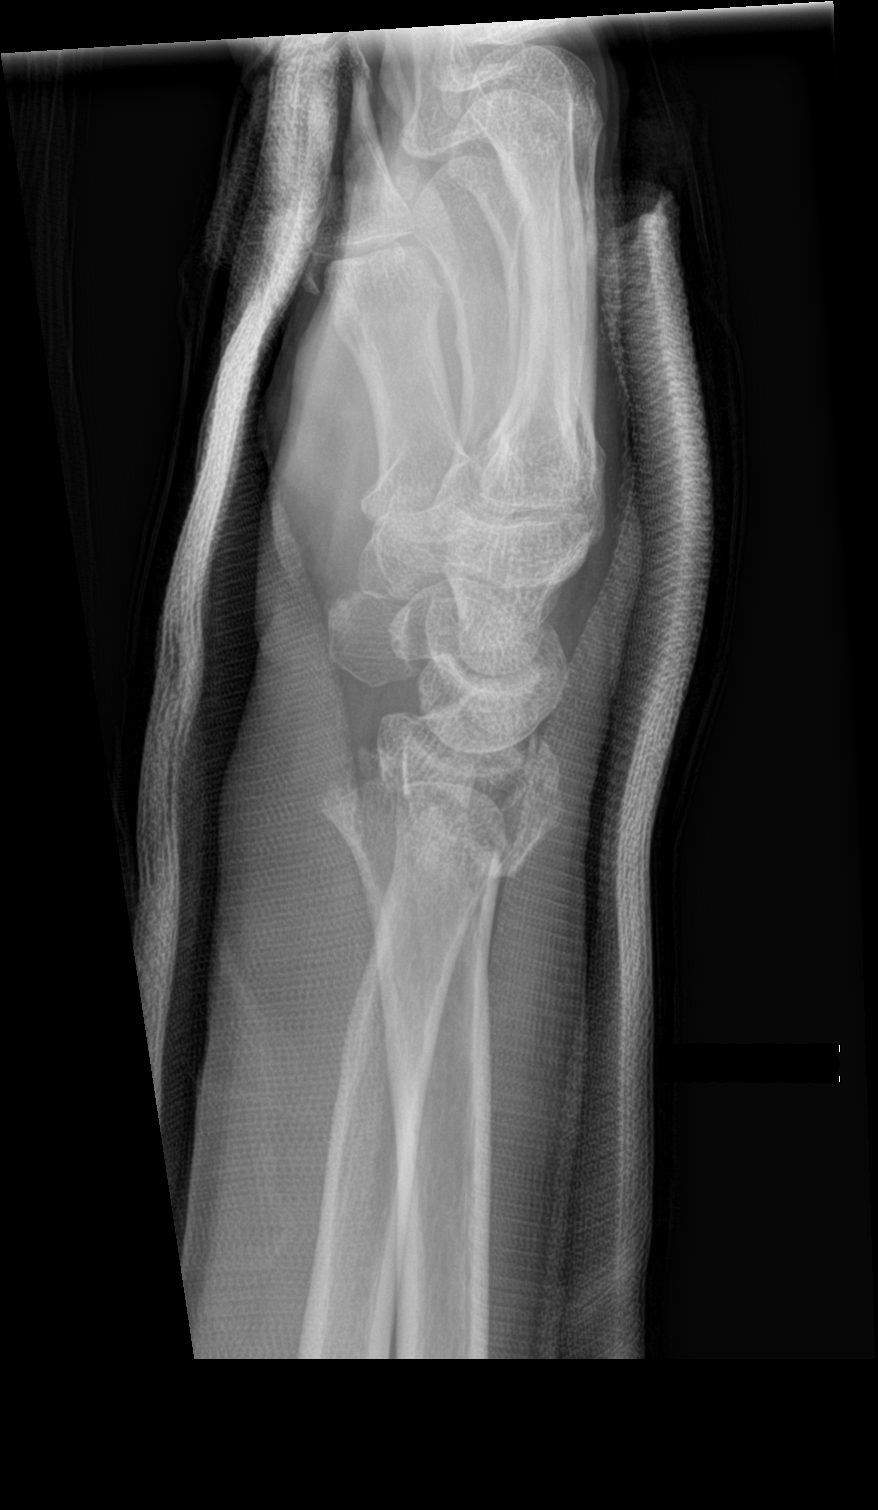

[2 of 2 positions shown; findings below may reference images not displayed]

FINDINGS: Distal radial and ulnar fractures are again identified. The degree
of impaction has been reduced somewhat. The posteriorly displaced
distal radial fracture fragments have been reduced somewhat although
still are proximally [DATE] bone width displaced posteriorly. No other
fractures are seen.
IMPRESSION: Status post reduction and casting. There remains some posterior
displacement of the distal radial fracture fragments as described.
